# Patient Record
Sex: Female | Born: 1995 | Race: Black or African American | Hispanic: No | Marital: Single | Smoking: Former smoker
Health system: Southern US, Community
[De-identification: ages and names within clinical notes are randomized; demographics above are authoritative.]

## PROBLEM LIST (undated history)

## (undated) DIAGNOSIS — J45909 Unspecified asthma, uncomplicated: Secondary | ICD-10-CM

---
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
## 2015-05-30 ENCOUNTER — Emergency Department (HOSPITAL_COMMUNITY): Payer: Medicaid Other

## 2015-05-30 ENCOUNTER — Encounter (HOSPITAL_COMMUNITY): Payer: Self-pay | Admitting: Emergency Medicine

## 2015-05-30 ENCOUNTER — Emergency Department (HOSPITAL_COMMUNITY)
Admission: EM | Admit: 2015-05-30 | Discharge: 2015-05-30 | Disposition: A | Discharge status: Home / Self Care | Payer: Medicaid Other | Attending: Physician Assistant | Admitting: Physician Assistant

## 2015-05-30 DIAGNOSIS — Z87891 Personal history of nicotine dependence: Secondary | ICD-10-CM | POA: Insufficient documentation

## 2015-05-30 DIAGNOSIS — Z3202 Encounter for pregnancy test, result negative: Secondary | ICD-10-CM | POA: Diagnosis not present

## 2015-05-30 DIAGNOSIS — R000 Tachycardia, unspecified: Secondary | ICD-10-CM | POA: Insufficient documentation

## 2015-05-30 DIAGNOSIS — R55 Syncope and collapse: Secondary | ICD-10-CM | POA: Insufficient documentation

## 2015-05-30 DIAGNOSIS — Z79899 Other long term (current) drug therapy: Secondary | ICD-10-CM | POA: Insufficient documentation

## 2015-05-30 DIAGNOSIS — A084 Viral intestinal infection, unspecified: Secondary | ICD-10-CM | POA: Insufficient documentation

## 2015-05-30 DIAGNOSIS — J45909 Unspecified asthma, uncomplicated: Secondary | ICD-10-CM | POA: Diagnosis not present

## 2015-05-30 DIAGNOSIS — R1110 Vomiting, unspecified: Secondary | ICD-10-CM | POA: Diagnosis present

## 2015-05-30 LAB — CBG MONITORING, ED: GLUCOSE-CAPILLARY: 95 mg/dL (ref 65–99)

## 2015-05-30 LAB — BASIC METABOLIC PANEL
Anion gap: 11 (ref 5–15)
BUN: 8 mg/dL (ref 6–20)
CHLORIDE: 102 mmol/L (ref 101–111)
CO2: 22 mmol/L (ref 22–32)
CREATININE: 0.65 mg/dL (ref 0.44–1.00)
Calcium: 9.1 mg/dL (ref 8.9–10.3)
Glucose, Bld: 94 mg/dL (ref 65–99)
POTASSIUM: 3.6 mmol/L (ref 3.5–5.1)
SODIUM: 135 mmol/L (ref 135–145)

## 2015-05-30 LAB — CBC
HCT: 40.7 % (ref 36.0–46.0)
Hemoglobin: 13.7 g/dL (ref 12.0–15.0)
MCH: 30.1 pg (ref 26.0–34.0)
MCHC: 33.7 g/dL (ref 30.0–36.0)
MCV: 89.5 fL (ref 78.0–100.0)
Platelets: 213 10*3/uL (ref 150–400)
RBC: 4.55 MIL/uL (ref 3.87–5.11)
RDW: 14 % (ref 11.5–15.5)
WBC: 5.1 10*3/uL (ref 4.0–10.5)

## 2015-05-30 LAB — URINALYSIS, ROUTINE W REFLEX MICROSCOPIC
GLUCOSE, UA: NEGATIVE mg/dL
Hgb urine dipstick: NEGATIVE
LEUKOCYTES UA: NEGATIVE
Nitrite: NEGATIVE
PROTEIN: NEGATIVE mg/dL
Specific Gravity, Urine: 1.031 — ABNORMAL HIGH (ref 1.005–1.030)
pH: 5.5 (ref 5.0–8.0)

## 2015-05-30 LAB — I-STAT BETA HCG BLOOD, ED (MC, WL, AP ONLY)

## 2015-05-30 MED ORDER — SODIUM CHLORIDE 0.9 % IV BOLUS (SEPSIS)
1000.0000 mL | Freq: Once | INTRAVENOUS | Status: AC
  Administered 2015-05-30: 1000 mL via INTRAVENOUS

## 2015-05-30 MED ORDER — ONDANSETRON HCL 4 MG/2ML IJ SOLN
4.0000 mg | Freq: Once | INTRAMUSCULAR | Status: AC
  Administered 2015-05-30: 4 mg via INTRAVENOUS
  Filled 2015-05-30: qty 2

## 2015-05-30 MED ORDER — ACETAMINOPHEN 325 MG PO TABS
650.0000 mg | ORAL_TABLET | Freq: Once | ORAL | Status: AC | PRN
  Administered 2015-05-30: 650 mg via ORAL
  Filled 2015-05-30: qty 2

## 2015-05-30 MED ORDER — SODIUM CHLORIDE 0.9 % IV BOLUS (SEPSIS)
1000.0000 mL | Freq: Once | INTRAVENOUS | Status: AC
Start: 2015-05-30 — End: 2015-05-30
  Administered 2015-05-30: 1000 mL via INTRAVENOUS

## 2015-05-30 MED ORDER — IBUPROFEN 800 MG PO TABS
800.0000 mg | ORAL_TABLET | Freq: Once | ORAL | Status: AC
  Administered 2015-05-30: 800 mg via ORAL
  Filled 2015-05-30: qty 1

## 2015-05-30 MED ORDER — ONDANSETRON HCL 4 MG PO TABS: 4.0000 mg | ORAL_TABLET | Freq: Three times a day (TID) | ORAL | Status: DC | PRN

## 2015-05-30 NOTE — Discharge Instructions (Signed)

## 2015-05-30 NOTE — ED Notes (Signed)
Patient presents for 3 episodes of syncope in 2 days and 3 episodes of emesis today. Denies fever/chills at home, denies diarrhea. Denies urinary symptoms.

## 2015-05-30 NOTE — ED Provider Notes (Signed)
CSN: [id number]     Arrival date & time 05/30/15  1620 History   First MD Initiated Contact with Patient 05/30/15 1831     Chief Complaint  Patient presents with  . Near Syncope  . Emesis     (Consider location/radiation/quality/duration/timing/severity/associated sxs/prior Treatment) HPI  Patient's 20 year old female presenting with 2-3 days of nausea, low-grade fever, congestion, malaise. Symptoms are consistent with the flu which is going around.  Patient denies any urinary complaints. Does complain of back pain occasionally. No history of stones. Patient denies any diarrhea. Patient has not been taking anything over-the-counter at home.  Patient reports feeling lightheaded several times in the last couple days. She does feel dehydrated.  Past Medical History  Diagnosis Date  . Asthma    History reviewed. No pertinent past surgical history. No family history on file. Social History  Substance Use Topics  . Smoking status: Former Games developer  . Smokeless tobacco: None  . Alcohol Use: No   OB History    No data available     Review of Systems  Constitutional: Positive for fever and fatigue. Negative for activity change.  HENT: Positive for congestion.   Respiratory: Negative for shortness of breath.   Cardiovascular: Negative for chest pain.  Gastrointestinal: Positive for nausea and vomiting. Negative for abdominal pain and diarrhea.  Genitourinary: Positive for flank pain. Negative for dysuria.  Neurological: Positive for weakness and light-headedness. Negative for dizziness.  Psychiatric/Behavioral: Negative for agitation.      Allergies  Review of patient's allergies indicates no known allergies.  Home Medications   Prior to Admission medications   Medication Sig Start Date End Date Taking? Authorizing Provider  diphenhydrAMINE (BENADRYL) 25 MG tablet Take 25 mg by mouth every 6 (six) hours as needed (vomitting).   Yes Historical Provider, MD  acetaminophen  (TYLENOL) 500 MG tablet Take 1,000 mg by mouth every 6 (six) hours as needed for mild pain.    Historical Provider, MD  albuterol (PROVENTIL HFA;VENTOLIN HFA) 108 (90 BASE) MCG/ACT inhaler Inhale 4 puffs into the lungs every 6 (six) hours as needed for wheezing or shortness of breath.    Historical Provider, MD  Alum & Mag Hydroxide-Simeth (MAGIC MOUTHWASH W/LIDOCAINE) SOLN Take 5 mLs by mouth 4 (four) times daily as needed for mouth pain. Patient not taking: Reported on 10/02/2014 09/29/14   [name], PA-C  clindamycin (CLEOCIN) 150 MG capsule Take 2 capsules (300 mg total) by mouth 3 (three) times daily. Patient not taking: Reported on 05/30/2015 10/02/14   [name], PA-C  cyclobenzaprine (FLEXERIL) 10 MG tablet Take 1 tablet (10 mg total) by mouth 3 (three) times daily as needed for muscle spasms. Patient not taking: Reported on 09/28/2014 08/15/14   [name], MD  fexofenadine (ALLEGRA) 180 MG tablet Take 180 mg by mouth daily as needed for allergies.     Historical Provider, MD  HYDROcodone-acetaminophen (HYCET) 7.5-325 mg/15 ml solution Take 15 mLs by mouth every 4 (four) hours as needed for moderate pain. Patient not taking: Reported on 05/30/2015 10/02/14 10/02/15  [name], PA-C  ibuprofen (ADVIL,MOTRIN) 600 MG tablet Take 1 tablet (600 mg total) by mouth every 6 (six) hours as needed. Patient not taking: Reported on 05/30/2015 09/29/14   [name], PA-C  ondansetron (ZOFRAN) 4 MG tablet Take 1 tablet (4 mg total) by mouth every 8 (eight) hours as needed for nausea or vomiting. 05/30/15   Bijon Mineer Lyn Kailey Esquilin, MD  traMADol (ULTRAM) 50 MG tablet Take 1 tablet (  50 mg total) by mouth every 6 (six) hours as needed. Patient not taking: Reported on 09/28/2014 08/15/14   [name], MD   BP 118/68 mmHg  Pulse 99  Temp(Src) 101.2 F (38.4 C) (Oral)  Resp 20  SpO2 100%  LMP 05/09/2015 Physical Exam  Constitutional: She is oriented to person, place, and time. She  appears well-developed and well-nourished.  HENT:  Head: Normocephalic and atraumatic.  Dry mucous membranes  Eyes: Conjunctivae are normal. Right eye exhibits no discharge.  Neck: Neck supple.  Cardiovascular: Regular rhythm and normal heart sounds.   No murmur heard. Tachycardia.  Pulmonary/Chest: Effort normal and breath sounds normal. She has no wheezes. She has no rales.  Abdominal: Soft. She exhibits no distension. There is no tenderness.  Musculoskeletal: Normal range of motion. She exhibits no edema.  Neurological: She is oriented to person, place, and time. No cranial nerve deficit.  Skin: Skin is warm and dry. No rash noted. She is not diaphoretic.  Psychiatric: She has a normal mood and affect. Her behavior is normal.  Nursing note and vitals reviewed.   ED Course  Procedures (including critical care time) Labs Review Labs Reviewed  URINALYSIS, ROUTINE W REFLEX MICROSCOPIC (NOT AT [facility]) - Abnormal; Notable for the following:    APPearance CLOUDY (*)    Specific Gravity, Urine 1.031 (*)    Bilirubin Urine SMALL (*)    Ketones, ur >80 (*)    All other components within normal limits  BASIC METABOLIC PANEL  CBC  CBG MONITORING, ED  I-STAT BETA HCG BLOOD, ED ([facility], [facility], [facility] ONLY)    Imaging Review Dg Chest 2 View  05/30/2015  CLINICAL DATA:  Three episodes of syncope in 2 days with vomiting today. EXAM: CHEST  2 VIEW COMPARISON:  08/15/2014. FINDINGS: The heart size and mediastinal contours are within normal limits. Both lungs are clear. The visualized skeletal structures are unremarkable. IMPRESSION: No active cardiopulmonary disease. Electronically Signed   By: Kennith Center M.D.   On: 05/30/2015 19:00   I have personally reviewed and evaluated these images and lab results as part of my medical decision-making.   EKG Interpretation   Date/Time:  Wednesday May 30 2015 16:43:03 EST Ventricular Rate:  125 PR Interval:  130 QRS Duration: 71 QT Interval:  274 QTC  Calculation: 395 R Axis:   45 Text Interpretation:  Sinus tachycardia Borderline repolarization  abnormality Sinus tachycardia Confirmed by Kandis Mannan ([id number]) on  05/30/2015 6:33:11 PM      MDM   Final diagnoses:  Viral gastroenteritis    Patient's 20 year old female presenting with a couple days of flulike illness. Patient had mild nausea occasional vomiting lightheadedness, congestion, cough. She's had fevers at home which are mild but has not been taking anything. She on arrival here is tachycardic, dry mucous membranes. We will start IV, give fluids. Doubt any intra-abdominal pathology given her soft abdomen on exam. Suspect that she is having the viral syndrome that is going around. We'll make sure the patient does not have a urinary tract infection or pneumonia requiring antibiotics. Otherwise we will give symptomatic care for home.  Patient tolerated PO, feels improved.   [place] Randall An, MD 05/30/15 2258

## 2015-05-30 NOTE — ED Notes (Signed)
Patient transported to X-ray 

## 2015-05-30 NOTE — ED Notes (Signed)
Pt able to tolerate ginger ale with no difficulty

## 2015-05-30 NOTE — ED Notes (Signed)
Off-going nurse reports patient refusing IV. I have spoken with the pt and she states that she is willing to try.

## 2015-06-30 ENCOUNTER — Encounter (HOSPITAL_COMMUNITY): Payer: Self-pay | Admitting: Emergency Medicine

## 2015-06-30 ENCOUNTER — Emergency Department (HOSPITAL_COMMUNITY)
Admission: EM | Admit: 2015-06-30 | Discharge: 2015-06-30 | Disposition: A | Discharge status: Home / Self Care | Payer: Medicaid Other | Attending: Emergency Medicine | Admitting: Emergency Medicine

## 2015-06-30 ENCOUNTER — Emergency Department (HOSPITAL_COMMUNITY): Payer: Medicaid Other

## 2015-06-30 DIAGNOSIS — Y9289 Other specified places as the place of occurrence of the external cause: Secondary | ICD-10-CM | POA: Diagnosis not present

## 2015-06-30 DIAGNOSIS — S86911A Strain of unspecified muscle(s) and tendon(s) at lower leg level, right leg, initial encounter: Secondary | ICD-10-CM | POA: Insufficient documentation

## 2015-06-30 DIAGNOSIS — Z87891 Personal history of nicotine dependence: Secondary | ICD-10-CM | POA: Diagnosis not present

## 2015-06-30 DIAGNOSIS — Z79899 Other long term (current) drug therapy: Secondary | ICD-10-CM | POA: Insufficient documentation

## 2015-06-30 DIAGNOSIS — Y9339 Activity, other involving climbing, rappelling and jumping off: Secondary | ICD-10-CM | POA: Diagnosis not present

## 2015-06-30 DIAGNOSIS — W1789XA Other fall from one level to another, initial encounter: Secondary | ICD-10-CM | POA: Insufficient documentation

## 2015-06-30 DIAGNOSIS — Y998 Other external cause status: Secondary | ICD-10-CM | POA: Insufficient documentation

## 2015-06-30 DIAGNOSIS — J45909 Unspecified asthma, uncomplicated: Secondary | ICD-10-CM | POA: Diagnosis not present

## 2015-06-30 DIAGNOSIS — S8991XA Unspecified injury of right lower leg, initial encounter: Secondary | ICD-10-CM | POA: Diagnosis present

## 2015-06-30 DIAGNOSIS — S8992XA Unspecified injury of left lower leg, initial encounter: Secondary | ICD-10-CM | POA: Diagnosis not present

## 2015-06-30 DIAGNOSIS — W19XXXA Unspecified fall, initial encounter: Secondary | ICD-10-CM

## 2015-06-30 MED ORDER — HYDROCODONE-ACETAMINOPHEN 5-325 MG PO TABS: 1.0000 | ORAL_TABLET | Freq: Four times a day (QID) | ORAL | Status: DC | PRN

## 2015-06-30 MED ORDER — HYDROCODONE-ACETAMINOPHEN 5-325 MG PO TABS
2.0000 | ORAL_TABLET | Freq: Once | ORAL | Status: AC
  Administered 2015-06-30: 2 via ORAL
  Filled 2015-06-30: qty 2

## 2015-06-30 MED ORDER — IBUPROFEN 800 MG PO TABS: 800.0000 mg | ORAL_TABLET | Freq: Three times a day (TID) | ORAL | Status: DC

## 2015-06-30 NOTE — ED Provider Notes (Signed)
CSN: [id number]     Arrival date & time 06/30/15  1547 History  By signing my name below, I, Tracey Lam, attest that this documentation has been prepared under the direction and in the presence of Annasofia Pohl PA-C. Electronically Signed: Leone Payor, Scribe. 06/30/2015. 5:03 PM.    Chief Complaint  Patient presents with  . Knee Pain  . Fall   The history is provided by the patient. No language interpreter was used.     HPI Comments: Tracey Lam is a 20 y.o. female who presents to the Emergency Department complaining of sudden onset, constant right leg pain and left shin pain that began after jumping out of a window. Patient states she was in a situation that she need to remove herself from so she jumped out a second story and fell about 10-15 ft. She states most of her weight was on her right leg which is hurting the most. She states she was not able to stand immediately after the fall. She denies head injury or LOC. She states moving or bearing weight on the right leg is painful. She denies numbness, weakness.    Past Medical History  Diagnosis Date  . Asthma    History reviewed. No pertinent past surgical history. History reviewed. No pertinent family history. Social History  Substance Use Topics  . Smoking status: Former Games developer  . Smokeless tobacco: None  . Alcohol Use: No   OB History    No data available     Review of Systems  Musculoskeletal: Positive for myalgias, joint swelling (right knee) and arthralgias (right leg, left shin).  Skin: Negative for wound.  Neurological: Negative for weakness and numbness.    Allergies  Review of patient's allergies indicates no known allergies.  Home Medications   Prior to Admission medications   Medication Sig Start Date End Date Taking? Authorizing Provider  acetaminophen (TYLENOL) 500 MG tablet Take 1,000 mg by mouth every 6 (six) hours as needed for mild pain.    Historical Provider, MD  albuterol (PROVENTIL HFA;VENTOLIN HFA)  108 (90 BASE) MCG/ACT inhaler Inhale 4 puffs into the lungs every 6 (six) hours as needed for wheezing or shortness of breath.    Historical Provider, MD  Alum & Mag Hydroxide-Simeth (MAGIC MOUTHWASH W/LIDOCAINE) SOLN Take 5 mLs by mouth 4 (four) times daily as needed for mouth pain. Patient not taking: Reported on 10/02/2014 09/29/14   [name], PA-C  clindamycin (CLEOCIN) 150 MG capsule Take 2 capsules (300 mg total) by mouth 3 (three) times daily. Patient not taking: Reported on 05/30/2015 10/02/14   [name], PA-C  cyclobenzaprine (FLEXERIL) 10 MG tablet Take 1 tablet (10 mg total) by mouth 3 (three) times daily as needed for muscle spasms. Patient not taking: Reported on 09/28/2014 08/15/14   [name], MD  diphenhydrAMINE (BENADRYL) 25 MG tablet Take 25 mg by mouth every 6 (six) hours as needed (vomitting).    Historical Provider, MD  fexofenadine (ALLEGRA) 180 MG tablet Take 180 mg by mouth daily as needed for allergies.     Historical Provider, MD  HYDROcodone-acetaminophen (HYCET) 7.5-325 mg/15 ml solution Take 15 mLs by mouth every 4 (four) hours as needed for moderate pain. Patient not taking: Reported on 05/30/2015 10/02/14 10/02/15  [name], PA-C  ibuprofen (ADVIL,MOTRIN) 600 MG tablet Take 1 tablet (600 mg total) by mouth every 6 (six) hours as needed. Patient not taking: Reported on 05/30/2015 09/29/14   [name], PA-C  ondansetron (ZOFRAN) 4 MG  tablet Take 1 tablet (4 mg total) by mouth every 8 (eight) hours as needed for nausea or vomiting. 05/30/15   [name], MD  traMADol (ULTRAM) 50 MG tablet Take 1 tablet (50 mg total) by mouth every 6 (six) hours as needed. Patient not taking: Reported on 09/28/2014 08/15/14   [name], MD   BP 110/59 mmHg  Pulse 82  Temp(Src) 98.2 F (36.8 C) (Oral)  Resp 16  SpO2 98%  LMP 06/30/2015 Physical Exam  Constitutional: She is oriented to person, place, and time. She appears well-developed and  well-nourished. No distress.  HENT:  Head: Normocephalic and atraumatic.  Right Ear: External ear normal.  Left Ear: External ear normal.  Nose: Nose normal.  Mouth/Throat: Oropharynx is clear and moist. No oropharyngeal exudate.  Eyes: Conjunctivae and EOM are normal. Pupils are equal, round, and reactive to light. Right eye exhibits no discharge. Left eye exhibits no discharge. No scleral icterus.  Neck: Normal range of motion. Neck supple. No JVD present. No tracheal deviation present.  Cardiovascular: Normal rate and regular rhythm.   Pulmonary/Chest: Effort normal and breath sounds normal. No stridor. No respiratory distress.  Abdominal: She exhibits no distension.  Musculoskeletal: She exhibits edema and tenderness.       Right hip: Normal.       Right knee: She exhibits decreased range of motion and swelling. She exhibits no effusion, no ecchymosis, no deformity, no laceration, no erythema, normal alignment and normal patellar mobility. Tenderness found. No patellar tendon tenderness noted.       Right ankle: Normal.       Right upper leg: She exhibits no tenderness, no bony tenderness, no swelling and no deformity.       Right lower leg: She exhibits no tenderness, no bony tenderness and no swelling.  Lymphadenopathy:    She has no cervical adenopathy.  Neurological: She is alert and oriented to person, place, and time. She exhibits normal muscle tone. Coordination normal.  Skin: Skin is warm and dry. No rash noted. She is not diaphoretic. No erythema. No pallor.  Psychiatric: She has a normal mood and affect. Her behavior is normal. Judgment and thought content normal.  Nursing note and vitals reviewed.   ED Course  Procedures (including critical care time)  DIAGNOSTIC STUDIES: Oxygen Saturation is 98% on RA, normal by my interpretation.    COORDINATION OF CARE: 5:11 PM Discussed treatment plan with pt at bedside and pt agreed to plan.   Labs Review Labs Reviewed - No  data to display  Imaging Review Dg Tibia/fibula Left  06/30/2015  CLINICAL DATA:  Left tibia/ fibula pain after fall EXAM: LEFT TIBIA AND FIBULA - 2 VIEW COMPARISON:  None. FINDINGS: There is no evidence of fracture or other focal bone lesions. Soft tissues are unremarkable. IMPRESSION: Negative. Electronically Signed   By: Delbert Phenix M.D.   On: 06/30/2015 17:00   Dg Knee Complete 4 Views Right  06/30/2015  CLINICAL DATA:  Right knee pain after fall EXAM: RIGHT KNEE - COMPLETE 4+ VIEW COMPARISON:  None. FINDINGS: There is no evidence of fracture, dislocation, or joint effusion. There is no evidence of arthropathy or other focal bone abnormality. Soft tissues are unremarkable. IMPRESSION: Negative. Electronically Signed   By: Delbert Phenix M.D.   On: 06/30/2015 16:59   I have personally reviewed and evaluated these images as part of my medical decision-making.   EKG Interpretation None      MDM   Patient  is a 20 year old female who felt she was unsafe situation and thought she had the choice but to jump out of a window that was approximately 10 feet off the ground. She landed mostly on her right leg and right side, landing in the mud of the yard. She has not been able to bear weight.  She complains of right knee pain and swelling but also has diffuse right leg and hip pain. X-rays were negative for fracture dislocation. Exam is pertinent for mild swelling, but severely limited by pain, unable to efficiently evaluate ligamentous injury. We'll immobilize get crutches and pain medications with Ortho follow-up. Patient's muscle compartments of likely soft, she had normal dorsal pedis and posterior tibialis pulses, normal sensation. Will also give resource list. She was discharged in good condition.  Final diagnoses:  Fall, initial encounter  Strain of right knee and leg, initial encounter    I personally performed the services described in this documentation, which was scribed in my presence.  The recorded information has been reviewed and is accurate.     Danelle BerryLeisa Gaines Cartmell, PA-C 07/03/15 2101  Linwood DibblesJon Knapp, MD 07/03/15 2130

## 2015-06-30 NOTE — Discharge Instructions (Signed)
Knee Sprain A knee sprain is a tear in one of the strong, fibrous tissues that connect the bones (ligaments) in your knee. The severity of the sprain depends on how much of the ligament is torn. The tear can be either partial or complete. CAUSES  Often, sprains are a result of a fall or injury. The force of the impact causes the fibers of your ligament to stretch too much. This excess tension causes the fibers of your ligament to tear. SIGNS AND SYMPTOMS  You may have some loss of motion in your knee. Other symptoms include:  Bruising.  Pain in the knee area.  Tenderness of the knee to the touch.  Swelling. DIAGNOSIS  To diagnose a knee sprain, your health care provider will physically examine your knee. Your health care provider may also suggest an X-ray exam of your knee to make sure no bones are broken. TREATMENT  If your ligament is only partially torn, treatment usually involves keeping the knee in a fixed position (immobilization) or bracing your knee for activities that require movement for several weeks. To do this, your health care provider will apply a bandage, cast, or splint to keep your knee from moving and to support your knee during movement until it heals. For a partially torn ligament, the healing process usually takes 4-6 weeks. If your ligament is completely torn, depending on which ligament it is, you may need surgery to reconnect the ligament to the bone or reconstruct it. After surgery, a cast or splint may be applied and will need to stay on your knee for 4-6 weeks while your ligament heals. HOME CARE INSTRUCTIONS  Keep your injured knee elevated to decrease swelling.  To ease pain and swelling, apply ice to the injured area:  Put ice in a plastic bag.  Place a towel between your skin and the bag.  Leave the ice on for 20 minutes, 2-3 times a day.  Only take medicine for pain as directed by your health care provider.  Do not leave your knee unprotected until  pain and stiffness go away (usually 4-6 weeks).  If you have a cast or splint, do not allow it to get wet. If you have been instructed not to remove it, cover it with a plastic bag when you shower or bathe. Do not swim.  Your health care provider may suggest exercises for you to do during your recovery to prevent or limit permanent weakness and stiffness. SEEK IMMEDIATE MEDICAL CARE IF:  Your cast or splint becomes damaged.  Your pain becomes worse.  You have significant pain, swelling, or numbness below the cast or splint. MAKE SURE YOU:  Understand these instructions.  Will watch your condition.  Will get help right away if you are not doing well or get worse.   This information is not intended to replace advice given to you by your health care provider. Make sure you discuss any questions you have with your health care provider.   Document Released: 03/31/2005 Document Revised: 04/21/2014 Document Reviewed: 11/10/2012 Elsevier Interactive Patient Education 2016 [employer]   RICE for Routine Care of Injuries Theroutine careofmanyinjuriesincludes rest, ice, compression, and elevation (RICE therapy). RICE therapy is often recommended for injuries to soft tissues, such as a muscle strain, ligament injuries, bruises, and overuse injuries. It can also be used for some bony injuries. Using RICE therapy can help to relieve pain, lessen swelling, and enable your body to heal. Rest Rest is required to allow your body to  heal. This usually involves reducing your normal activities and avoiding use of the injured part of your body. Generally, you can return to your normal activities when you are comfortable and have been given permission by your health care provider. Ice Icing your injury helps to keep the swelling down, and it lessens pain. Do not apply ice directly to your skin.  Put ice in a plastic bag.  Place a towel between your skin and the bag.  Leave the ice on for 20  minutes, 2-3 times a day. Do this for as long as you are directed by your health care provider. Compression Compression means putting pressure on the injured area. Compression helps to keep swelling down, gives support, and helps with discomfort. Compression may be done with an elastic bandage. If an elastic bandage has been applied, follow these general tips:  Remove and reapply the bandage every 3-4 hours or as directed by your health care provider.  Make sure the bandage is not wrapped too tightly, because this can cut off circulation. If part of your body beyond the bandage becomes blue, numb, cold, swollen, or more painful, your bandage is most likely too tight. If this occurs, remove your bandage and reapply it more loosely.  See your health care provider if the bandage seems to be making your problems worse rather than better. Elevation Elevation means keeping the injured area raised. This helps to lessen swelling and decrease pain. If possible, your injured area should be elevated at or above the level of your heart or the center of your chest. WHEN SHOULD I SEEK MEDICAL CARE? You should seek medical care if:  Your pain and swelling continue.  Your symptoms are getting worse rather than improving. These symptoms may indicate that further evaluation or further X-rays are needed. Sometimes, X-rays may not show a small broken bone (fracture) until a number of days later. Make a follow-up appointment with your health care provider. WHEN SHOULD I SEEK IMMEDIATE MEDICAL CARE? You should seek immediate medical care if:  You have sudden severe pain at or below the area of your injury.  You have redness or increased swelling around your injury.  You have tingling or numbness at or below the area of your injury that does not improve after you remove the elastic bandage.   This information is not intended to replace advice given to you by your health care provider. Make sure you discuss any  questions you have with your health care provider.   Document Released: 07/13/2000 Document Revised: 12/20/2014 Document Reviewed: 03/08/2014 Elsevier Interactive Patient Education 2016 [employer] Inc[employer]   Tendon Injury Tendons are strong, cordlike structures that connect muscle to bone. Tendons are made up of woven fibers, like a rope. A tendon injury is a tear (rupture) of the tendon. The rupture may be partial (only a few of the fibers in your tendon rupture) or complete (your entire tendon ruptures). CAUSES  Tendon injuries can be caused by high-stress activities, such as sports. They also can be caused by a repetitive injury or by a single injury from an excessive, rapid force. SYMPTOMS  Symptoms of tendon injury include pain when you move the joint close to the tendon. Other symptoms are swelling, redness, and warmth. DIAGNOSIS  Tendon injuries often can be diagnosed by physical exam. However, sometimes an X-ray exam or advanced imaging, such as magnetic resonance imaging (MRI), is necessary to determine the extent of the injury. TREATMENT  Partial tendon ruptures often can be treated  with immobilization. A splint, bandage, or removable brace usually is used to immobilize the injured tendon. Most injured tendons need to be immobilized for 1-2 months before they are completely healed. Complete tendon ruptures may require surgical reattachment.   This information is not intended to replace advice given to you by your health care provider. Make sure you discuss any questions you have with your health care provider.   Document Released: 05/08/2004 Document Revised: 03/20/2011 Document Reviewed: 06/22/2011 Elsevier Interactive Patient Education 2016 [employer] Inc[employer]  Muscle Strain A muscle strain is an injury that occurs when a muscle is stretched beyond its normal length. Usually a small number of muscle fibers are torn when this happens. Muscle strain is rated in degrees. First-degree strains  have the least amount of muscle fiber tearing and pain. Second-degree and third-degree strains have increasingly more tearing and pain.  Usually, recovery from muscle strain takes 1-2 weeks. Complete healing takes 5-6 weeks.  CAUSES  Muscle strain happens when a sudden, violent force placed on a muscle stretches it too far. This may occur with lifting, sports, or a fall.  RISK FACTORS Muscle strain is especially common in athletes.  SIGNS AND SYMPTOMS At the site of the muscle strain, there may be:  Pain.  Bruising.  Swelling.  Difficulty using the muscle due to pain or lack of normal function. DIAGNOSIS  Your health care provider will perform a physical exam and ask about your medical history. TREATMENT  Often, the best treatment for a muscle strain is resting, icing, and applying cold compresses to the injured area.  HOME CARE INSTRUCTIONS   Use the PRICE method of treatment to promote muscle healing during the first 2-3 days after your injury. The PRICE method involves:  Protecting the muscle from being injured again.  Restricting your activity and resting the injured body part.  Icing your injury. To do this, put ice in a plastic bag. Place a towel between your skin and the bag. Then, apply the ice and leave it on from 15-20 minutes each hour. After the third day, switch to moist heat packs.  Apply compression to the injured area with a splint or elastic bandage. Be careful not to wrap it too tightly. This may interfere with blood circulation or increase swelling.  Elevate the injured body part above the level of your heart as often as you can.  Only take over-the-counter or prescription medicines for pain, discomfort, or fever as directed by your health care provider.  Warming up prior to exercise helps to prevent future muscle strains. SEEK MEDICAL CARE IF:   You have increasing pain or swelling in the injured area.  You have numbness, tingling, or a significant loss  of strength in the injured area. MAKE SURE YOU:   Understand these instructions.  Will watch your condition.  Will get help right away if you are not doing well or get worse.   This information is not intended to replace advice given to you by your health care provider. Make sure you discuss any questions you have with your health care provider.   Document Released: 03/31/2005 Document Revised: 01/19/2013 Document Reviewed: 10/28/2012 Elsevier Interactive Patient Education [employer]

## 2015-06-30 NOTE — ED Notes (Signed)
Pt reports she jumped out of a 2nd story window and fell on the ground. Pt landed on out-stretched hands and feet. Did not hit head. Pt complains of pain in R knee and L shin. No obvious deformity.

## 2015-07-06 ENCOUNTER — Encounter (HOSPITAL_COMMUNITY): Payer: Self-pay | Admitting: Emergency Medicine

## 2015-07-06 ENCOUNTER — Emergency Department (HOSPITAL_COMMUNITY)
Admission: EM | Admit: 2015-07-06 | Discharge: 2015-07-06 | Disposition: A | Discharge status: Home / Self Care | Payer: Medicaid Other | Attending: Emergency Medicine | Admitting: Emergency Medicine

## 2015-07-06 DIAGNOSIS — S8991XD Unspecified injury of right lower leg, subsequent encounter: Secondary | ICD-10-CM

## 2015-07-06 DIAGNOSIS — Z87891 Personal history of nicotine dependence: Secondary | ICD-10-CM | POA: Insufficient documentation

## 2015-07-06 DIAGNOSIS — X58XXXD Exposure to other specified factors, subsequent encounter: Secondary | ICD-10-CM | POA: Insufficient documentation

## 2015-07-06 DIAGNOSIS — Z79899 Other long term (current) drug therapy: Secondary | ICD-10-CM | POA: Diagnosis not present

## 2015-07-06 DIAGNOSIS — J45909 Unspecified asthma, uncomplicated: Secondary | ICD-10-CM | POA: Diagnosis not present

## 2015-07-06 MED ORDER — CYCLOBENZAPRINE HCL 5 MG PO TABS: 5.0000 mg | ORAL_TABLET | Freq: Three times a day (TID) | ORAL | Status: DC

## 2015-07-06 MED ORDER — TRAMADOL HCL 50 MG PO TABS: 50.0000 mg | ORAL_TABLET | Freq: Four times a day (QID) | ORAL | Status: DC | PRN

## 2015-07-06 MED ORDER — CYCLOBENZAPRINE HCL 10 MG PO TABS
5.0000 mg | ORAL_TABLET | Freq: Once | ORAL | Status: AC
  Administered 2015-07-06: 5 mg via ORAL
  Filled 2015-07-06: qty 1

## 2015-07-06 NOTE — ED Notes (Signed)
Pt was seen on 3/16 when she jumped out of a window and landed on her leg wrong. Pt states she called to follow up with the suggested ortho specialist, but was not able to get an appt until this Wed. She states the pain is unmanageable. Pt has been taking motrin, last dose of which was around 1730.

## 2015-07-06 NOTE — Discharge Instructions (Signed)
Take the immobilizer off at night and do the exercies 2-3 times per day

## 2015-07-06 NOTE — ED Provider Notes (Signed)
History  By signing my name below, I, Karle PlumberJennifer Tensley, attest that this documentation has been prepared under the direction and in the presence of Earley FavorGail Laketra Bowdish, FNP. Electronically Signed: Karle PlumberJennifer Tensley, ED Scribe. 07/06/2015. 9:21 PM.  Chief Complaint  Patient presents with  . Leg Injury   The history is provided by the patient and medical records. No language interpreter was used.    HPI Comments:  Tracey Lam is a 20 y.o. female who presents to the Emergency Department complaining of continued right knee pain after an injury about 8 days ago. She reports the pain is still present and she no longer has any pain medication. She was prescribed Vicodin and Ibuprofen and given a knee immobilizer in which she reports wearing at all times. Moving the knee increases her pain. She denies alleviating factors. She denies numbness, tingling or weakness of the right knee or RLE, bruising, wounds, fever, chills, nausea or vomiting. She states she has a follow up appt with orthopedics in a few days on 07/11/15. Her LMP ended yesterday.  Past Medical History  Diagnosis Date  . Asthma    History reviewed. No pertinent past surgical history. No family history on file. Social History  Substance Use Topics  . Smoking status: Former Games developermoker  . Smokeless tobacco: None  . Alcohol Use: No   OB History    No data available     Review of Systems  Constitutional: Negative for fever and chills.  Gastrointestinal: Negative for nausea and vomiting.  Musculoskeletal: Positive for joint swelling and arthralgias.  Skin: Negative for color change and wound.  Neurological: Negative for weakness and numbness.  All other systems reviewed and are negative.   Allergies  Review of patient's allergies indicates no known allergies.  Home Medications   Prior to Admission medications   Medication Sig Start Date End Date Taking? Authorizing Provider  acetaminophen (TYLENOL) 500 MG tablet Take 1,000 mg by mouth  every 6 (six) hours as needed for mild pain.    Historical Provider, MD  albuterol (PROVENTIL HFA;VENTOLIN HFA) 108 (90 BASE) MCG/ACT inhaler Inhale 4 puffs into the lungs every 6 (six) hours as needed for wheezing or shortness of breath.    Historical Provider, MD  Alum & Mag Hydroxide-Simeth (MAGIC MOUTHWASH W/LIDOCAINE) SOLN Take 5 mLs by mouth 4 (four) times daily as needed for mouth pain. Patient not taking: Reported on 10/02/2014 09/29/14   Antony MaduraKelly Humes, PA-C  clindamycin (CLEOCIN) 150 MG capsule Take 2 capsules (300 mg total) by mouth 3 (three) times daily. Patient not taking: Reported on 05/30/2015 10/02/14   Elpidio AnisShari Upstill, PA-C  cyclobenzaprine (FLEXERIL) 5 MG tablet Take 1 tablet (5 mg total) by mouth 3 (three) times daily. 07/06/15   Earley FavorGail Gertie Broerman, NP  diphenhydrAMINE (BENADRYL) 25 MG tablet Take 25 mg by mouth every 6 (six) hours as needed (vomitting).    Historical Provider, MD  fexofenadine (ALLEGRA) 180 MG tablet Take 180 mg by mouth daily as needed for allergies.     Historical Provider, MD  HYDROcodone-acetaminophen (NORCO/VICODIN) 5-325 MG tablet Take 1-2 tablets by mouth every 6 (six) hours as needed for severe pain. 06/30/15   Danelle BerryLeisa Tapia, PA-C  ibuprofen (ADVIL,MOTRIN) 800 MG tablet Take 1 tablet (800 mg total) by mouth 3 (three) times daily. 06/30/15   Danelle BerryLeisa Tapia, PA-C  ondansetron (ZOFRAN) 4 MG tablet Take 1 tablet (4 mg total) by mouth every 8 (eight) hours as needed for nausea or vomiting. 05/30/15   Courteney Randall AnLyn Mackuen, MD  traMADol (ULTRAM) 50 MG tablet Take 1 tablet (50 mg total) by mouth every 6 (six) hours as needed. 07/06/15   Earley Favor, NP   Triage Vitals: BP 128/80 mmHg  Pulse 86  Temp(Src) 98.2 F (36.8 C) (Oral)  Resp 16  Ht  (1.499 m)  SpO2 99%  LMP 06/30/2015 Physical Exam  Constitutional: She is oriented to person, place, and time. She appears well-developed and well-nourished.  HENT:  Head: Normocephalic and atraumatic.  Eyes: EOM are normal.  Neck:  Normal range of motion.  Cardiovascular: Normal rate.   Pulmonary/Chest: Effort normal.  Musculoskeletal: She exhibits edema and tenderness.       Right knee: She exhibits decreased range of motion and swelling. She exhibits no ecchymosis, no deformity, no laceration, no erythema, normal alignment and no LCL laxity. Tenderness found.  Neurological: She is alert and oriented to person, place, and time.  Skin: Skin is warm and dry.  Psychiatric: She has a normal mood and affect. Her behavior is normal.  Nursing note and vitals reviewed.   ED Course  Procedures (including critical care time) DIAGNOSTIC STUDIES: Oxygen Saturation is 99% on RA, normal by my interpretation.   COORDINATION OF CARE: 9:07 PM- Advised pt to stretch the knee as tolerated. Will prescribe muscle relaxer and told pt to keep ortho appt. Pt verbalizes understanding and agrees to plan.  Medications  cyclobenzaprine (FLEXERIL) tablet 5 mg (not administered)    Patient has not been taking leg out of immobilizer nor preforming any exercises.  Feel this is more pain from immobility.  I've given the patient some exercises that she can perform as well as a muscle relaxer.  I refilled her Vicodin for short-term use.  She is to keep her appointment with orthopedics on Wednesday as scheduled MDM   Final diagnoses:  Knee injury, right, subsequent encounter    I personally performed the services described in this documentation, which was scribed in my presence. The recorded information has been reviewed and is accurate.    Earley Favor, NP 07/06/15 2123  Lorre Nick, MD 07/06/15 2352

## 2015-07-24 ENCOUNTER — Emergency Department (HOSPITAL_COMMUNITY)
Admission: EM | Admit: 2015-07-24 | Discharge: 2015-07-24 | Disposition: A | Discharge status: Home / Self Care | Payer: Medicaid Other | Attending: Emergency Medicine | Admitting: Emergency Medicine

## 2015-07-24 ENCOUNTER — Emergency Department (HOSPITAL_COMMUNITY): Payer: Medicaid Other

## 2015-07-24 ENCOUNTER — Encounter (HOSPITAL_COMMUNITY): Payer: Self-pay | Admitting: Emergency Medicine

## 2015-07-24 DIAGNOSIS — Z87891 Personal history of nicotine dependence: Secondary | ICD-10-CM | POA: Insufficient documentation

## 2015-07-24 DIAGNOSIS — R0602 Shortness of breath: Secondary | ICD-10-CM

## 2015-07-24 DIAGNOSIS — R0789 Other chest pain: Secondary | ICD-10-CM | POA: Diagnosis not present

## 2015-07-24 DIAGNOSIS — Z79899 Other long term (current) drug therapy: Secondary | ICD-10-CM | POA: Insufficient documentation

## 2015-07-24 DIAGNOSIS — J45901 Unspecified asthma with (acute) exacerbation: Secondary | ICD-10-CM | POA: Insufficient documentation

## 2015-07-24 LAB — CBC WITH DIFFERENTIAL/PLATELET
BASOS ABS: 0 10*3/uL (ref 0.0–0.1)
Basophils Relative: 0 %
Eosinophils Absolute: 0 10*3/uL (ref 0.0–0.7)
Eosinophils Relative: 0 %
HEMATOCRIT: 41.8 % (ref 36.0–46.0)
HEMOGLOBIN: 14.8 g/dL (ref 12.0–15.0)
LYMPHS PCT: 18 %
Lymphs Abs: 1.9 10*3/uL (ref 0.7–4.0)
MCH: 29.7 pg (ref 26.0–34.0)
MCHC: 35.4 g/dL (ref 30.0–36.0)
MCV: 83.9 fL (ref 78.0–100.0)
MONO ABS: 0.9 10*3/uL (ref 0.1–1.0)
MONOS PCT: 8 %
NEUTROS ABS: 8.3 10*3/uL — AB (ref 1.7–7.7)
NEUTROS PCT: 74 %
Platelets: 306 10*3/uL (ref 150–400)
RBC: 4.98 MIL/uL (ref 3.87–5.11)
RDW: 13.7 % (ref 11.5–15.5)
WBC: 11.1 10*3/uL — ABNORMAL HIGH (ref 4.0–10.5)

## 2015-07-24 LAB — COMPREHENSIVE METABOLIC PANEL
ALBUMIN: 5.2 g/dL — AB (ref 3.5–5.0)
ALK PHOS: 77 U/L (ref 38–126)
ALT: 13 U/L — AB (ref 14–54)
ANION GAP: 14 (ref 5–15)
AST: 21 U/L (ref 15–41)
BILIRUBIN TOTAL: 1 mg/dL (ref 0.3–1.2)
BUN: 12 mg/dL (ref 6–20)
CALCIUM: 10 mg/dL (ref 8.9–10.3)
CO2: 21 mmol/L — AB (ref 22–32)
CREATININE: 0.62 mg/dL (ref 0.44–1.00)
Chloride: 104 mmol/L (ref 101–111)
GFR calc Af Amer: >60 mL/min (ref >60)
GFR calc non Af Amer: >60 mL/min (ref >60)
GLUCOSE: 95 mg/dL (ref 65–99)
Potassium: 3.4 mmol/L — ABNORMAL LOW (ref 3.5–5.1)
SODIUM: 139 mmol/L (ref 135–145)
TOTAL PROTEIN: 9.5 g/dL — AB (ref 6.5–8.1)

## 2015-07-24 LAB — D-DIMER, QUANTITATIVE: D-Dimer, Quant: <0.27 ug/mL-FEU (ref 0.00–0.50)

## 2015-07-24 LAB — I-STAT BETA HCG BLOOD, ED (MC, WL, AP ONLY): I-stat hCG, quantitative: <5 m[IU]/mL (ref <5)

## 2015-07-24 LAB — TROPONIN I: Troponin I: <0.03 ng/mL (ref <0.031)

## 2015-07-24 MED ORDER — POTASSIUM CHLORIDE ER 20 MEQ PO TBCR: 40.0000 meq | EXTENDED_RELEASE_TABLET | Freq: Every day | ORAL | Status: DC

## 2015-07-24 MED ORDER — ALBUTEROL SULFATE HFA 108 (90 BASE) MCG/ACT IN AERS: 1.0000 | INHALATION_SPRAY | Freq: Four times a day (QID) | RESPIRATORY_TRACT | Status: DC | PRN

## 2015-07-24 NOTE — ED Notes (Signed)
Per pt, states history of asthma-increased symptoms with activity, no SOB at the moment

## 2015-07-24 NOTE — ED Provider Notes (Signed)
Tracey Lam is a 20 y.o. female with a history of asthma, who presents to the Emergency Department complaining of intermittent episodes of sharp chest pain and SOB lasting 10-15 minutes, triggered by exertion, which began in this past week. She states she also had a syncopal episode yesterday following an episode of chest pain and SOB yesterday that occurred during sexual intercourse, which lasted for about a minute before she regained consciousness. She then laid and rested on the bed, which alleviated her symptoms, but her chest pain and SOB were triggered again when she got up to move around. She states she has not been moving much since injuring her right leg on 06/30/15; she states she has kept her right knee in an immobilizer at all times, has sat or laid around for most of the day, and only started walking around again 2-3 days ago. She reports mild SOB but no chest pain presently. She states her symptoms feel different from her usual asthma. She denies any recent surgeries. Patient reports [place] in [place]. She was tachycardic at 117 in triage, when it was rechecked approximately an hour later at my initial evaluation, pulse was down to 85.  Gen: afebrile, NAD HEENT: EOMI, MMM Resp: no resp distress CV: RRR, no MRG Abd: soft, NT, ND MsK: moving all extremities well Neuro: A&O x4  Patient was discussed with Dr. Patria Maneampos who agrees that patient will need lab work and is better suited being seen on the acute side of the emergency department instead of fast track. Chest x-ray and lab work was ordered and patient will be moved to a higher level of care for continued evaluation and management.    [facility] CenterJaime Pilcher Ward, PA-C 07/24/15 1510  Azalia BilisKevin Campos, MD 07/24/15 [phone]

## 2015-07-24 NOTE — ED Provider Notes (Signed)
CSN: [id number]     Arrival date & time 07/24/15  1247 History   First MD Initiated Contact with Patient 07/24/15 1428     Chief Complaint  Patient presents with  . Near Syncope  . Shortness of Breath    HPI   Tracey Lam is an 20 y.o. female with history of asthma who presents to the ED for evaluation of chest pain and SOB. She states that for the past two to three days she has had intermittent episodes of sharp left sided chest pain with associated shortness of breath that last 10-15 minutes. States the pain does not radiate. She states it feels different than past asthma exacerbations. She states that she had a few puffs of albuterol inhaler left at home and tried it when her symptoms first began with little relief. She states that the chest and SOB are sometimes associated with exertion but can also occur at rest. She states that she also had a syncopal episode yesterday during sexual intercourse. Her boyfriend states that pt suddenly became short of breath and passed out for a minute or two, then regained consciousness spontaneously. Pt states she felt very tired after the episode. She states she has passed out only once before. Of note, pt has been pretty much immobile since hurting her knee last month and being placed in a knee immobilizer on 06/30/15. She states that she typically spends her days in bed or on the couch since her injury. Pt denies OCP use. Endorses smoking 1 PPD. Denies h/o surgery, blood clots. Denies significant fam hx.   Past Medical History  Diagnosis Date  . Asthma    History reviewed. No pertinent past surgical history. No family history on file. Social History  Substance Use Topics  . Smoking status: Former Games developermoker  . Smokeless tobacco: None  . Alcohol Use: No   OB History    No data available     Review of Systems  All other systems reviewed and are negative.     Allergies  Review of patient's allergies indicates no known allergies.  Home Medications    Prior to Admission medications   Medication Sig Start Date End Date Taking? Authorizing Provider  acetaminophen (TYLENOL) 500 MG tablet Take 1,000 mg by mouth every 6 (six) hours as needed for mild pain.    Historical Provider, MD  albuterol (PROVENTIL HFA;VENTOLIN HFA) 108 (90 BASE) MCG/ACT inhaler Inhale 4 puffs into the lungs every 6 (six) hours as needed for wheezing or shortness of breath.    Historical Provider, MD  Alum & Mag Hydroxide-Simeth (MAGIC MOUTHWASH W/LIDOCAINE) SOLN Take 5 mLs by mouth 4 (four) times daily as needed for mouth pain. Patient not taking: Reported on 10/02/2014 09/29/14   Antony MaduraKelly Humes, PA-C  clindamycin (CLEOCIN) 150 MG capsule Take 2 capsules (300 mg total) by mouth 3 (three) times daily. Patient not taking: Reported on 05/30/2015 10/02/14   Elpidio AnisShari Upstill, PA-C  cyclobenzaprine (FLEXERIL) 5 MG tablet Take 1 tablet (5 mg total) by mouth 3 (three) times daily. 07/06/15   Earley FavorGail Schulz, NP  diphenhydrAMINE (BENADRYL) 25 MG tablet Take 25 mg by mouth every 6 (six) hours as needed (vomitting).    Historical Provider, MD  fexofenadine (ALLEGRA) 180 MG tablet Take 180 mg by mouth daily as needed for allergies.     Historical Provider, MD  HYDROcodone-acetaminophen (NORCO/VICODIN) 5-325 MG tablet Take 1-2 tablets by mouth every 6 (six) hours as needed for severe pain. 06/30/15   Danelle BerryLeisa Tapia, PA-C  ibuprofen (ADVIL,MOTRIN) 800 MG tablet Take 1 tablet (800 mg total) by mouth 3 (three) times daily. 06/30/15   Danelle Berry, PA-C  ondansetron (ZOFRAN) 4 MG tablet Take 1 tablet (4 mg total) by mouth every 8 (eight) hours as needed for nausea or vomiting. 05/30/15   [name], MD  traMADol (ULTRAM) 50 MG tablet Take 1 tablet (50 mg total) by mouth every 6 (six) hours as needed. 07/06/15   Earley Favor, NP   BP 131/76 mmHg  Pulse 85  Temp(Src) 98.2 F (36.8 C) (Oral)  Resp 14  SpO2 99%  LMP 06/30/2015 Physical Exam  Constitutional: She is oriented to person, place, and time.   HENT:  Right Ear: External ear normal.  Left Ear: External ear normal.  Nose: Nose normal.  Mouth/Throat: Oropharynx is clear and moist. No oropharyngeal exudate.  Eyes: Conjunctivae and EOM are normal. Pupils are equal, round, and reactive to light.  Neck: Normal range of motion. Neck supple.  Cardiovascular: Normal rate, regular rhythm, normal heart sounds and intact distal pulses.   Pulmonary/Chest: Effort normal and breath sounds normal. No respiratory distress. She has no wheezes. She has no rales. She exhibits no tenderness.  Abdominal: Soft. Bowel sounds are normal. She exhibits no distension. There is no tenderness. There is no rebound and no guarding.  Musculoskeletal: She exhibits no edema.  Neurological: She is alert and oriented to person, place, and time. No cranial nerve deficit.  Skin: Skin is warm and dry.  Psychiatric: She has a normal mood and affect.  Nursing note and vitals reviewed.  Filed Vitals:   07/24/15 1300 07/24/15 1419  BP: 119/91 131/76  Pulse: 117 85  Temp: 98.2 F (36.8 C)   TempSrc: Oral   Resp:  14  SpO2: 98% 99%     ED Course  Procedures (including critical care time) Labs Review Labs Reviewed  CBC WITH DIFFERENTIAL/PLATELET - Abnormal; Notable for the following:    WBC 11.1 (*)    Neutro Abs 8.3 (*)    All other components within normal limits  COMPREHENSIVE METABOLIC PANEL - Abnormal; Notable for the following:    Potassium 3.4 (*)    CO2 21 (*)    Total Protein 9.5 (*)    Albumin 5.2 (*)    ALT 13 (*)    All other components within normal limits  D-DIMER, QUANTITATIVE (NOT AT [facility])  TROPONIN I  I-STAT BETA HCG BLOOD, ED ([facility], [facility], [facility] ONLY)    Imaging Review Dg Chest 2 View  07/24/2015  CLINICAL DATA:  Intermittent episodes of sharp chest pain and shortness of breath lack lasting 10-15 minutes usually triggered by exertion semi: Syncopal episode yesterday; onset of symptoms 1 week ago. History of asthma. EXAM: CHEST  2 VIEW  COMPARISON:  PA and lateral chest x-ray of May 30, 2015. FINDINGS: The lungs are adequately inflated and clear. The heart and pulmonary vascularity are normal. The mediastinum is normal in width. There is no pleural effusion or pneumothorax. The bony thorax is unremarkable. IMPRESSION: There is no active cardiopulmonary disease. Electronically Signed   By: David  [place] M.D.   On: 07/24/2015 15:25   I have personally reviewed and evaluated these images and lab results as part of my medical decision-making.   EKG Interpretation   Date/Time:  Tuesday July 24 2015 15:45:07 EDT Ventricular Rate:  70 PR Interval:  124 QRS Duration: 87 QT Interval:  401 QTC Calculation: 433 R Axis:   68 Text Interpretation:  Sinus rhythm No significant change was found  Confirmed by CAMPOS  MD, Caryn Bee ([id number]) on 07/24/2015 3:50:44 PM      MDM   Final diagnoses:  Shortness of breath  Atypical chest pain    Workup unremarkable. Unclear etiology of chest pain and SOB. At this point have extremely low suspicion for ACS or PE given workup. Pt has no hypoxia. Her tachycardia is improved. Discussed with pt possible etiologies include anxiety/stress, GERD, or msk pain. Rx for albuterol given as pt ran out at home. INstructed to establish PCP (pt has been trying to establish) when possible for follow up. ER return precautions given.    Carlene Coria, PA-C 07/24/15 2331  Donnetta Hutching, MD 07/24/15 [phone]

## 2015-07-24 NOTE — ED Notes (Signed)
Patient is alert and oriented x3.  She was given DC instructions and follow up visit instructions.  Patient gave verbal understanding. She was DC ambulatory under her own power to home.  V/S stable.  He was not showing any signs of distress on DC 

## 2015-07-24 NOTE — ED Notes (Signed)
Pt taken to xray 

## 2015-07-24 NOTE — ED Notes (Signed)
Unable to collect labs patient in xray 

## 2015-07-24 NOTE — Discharge Instructions (Signed)
You were seen in the emergency room today for evaluation of chest pain and shortness of breath. Your bloodwork (including pregnancy test) were negative. Your chest x-ray was normal. Your chest pain is not likely due to an emergent cause. It could be musculoskeletal, due to stress/anxiety, or even heartburn. I will give you a refill of your albuterol inhaler for asthma exacerbations.  Return to the emergency room for worsening condition or new concerning symptoms. Follow up with your regular doctor. If you don't have a regular doctor use one of the numbers below to establish a primary care doctor.   Emergency Department Resource Guide 1) Find a Doctor and Pay Out of Pocket Although you won't have to find out who is covered by your insurance plan, it is a good idea to ask around and get recommendations. You will then need to call the office and see if the doctor you have chosen will accept you as a new patient and what types of options they offer for patients who are self-pay. Some doctors offer discounts or will set up payment plans for their patients who do not have insurance, but you will need to ask so you aren't surprised when you get to your appointment.  2) Contact Your Local Health Department Not all health departments have doctors that can see patients for sick visits, but many do, so it is worth a call to see if yours does. If you don't know where your local health department is, you can check in your phone book. The CDC also has a tool to help you locate your state's health department, and many state websites also have listings of all of their local health departments.  3) Find a Walk-in Clinic If your illness is not likely to be very severe or complicated, you may want to try a walk in clinic. These are popping up all over the country in pharmacies, drugstores, and shopping centers. They're usually staffed by nurse practitioners or physician assistants that have been trained to treat common  illnesses and complaints. They're usually fairly quick and inexpensive. However, if you have serious medical issues or chronic medical problems, these are probably not your best option.  No Primary Care Doctor: - Call Health Connect at  [phone] - they can help you locate a primary care doctor that  accepts your insurance, provides certain services, etc. - Physician Referral [phone]  Emergency Department Resource Guide 1) Find a Doctor and Pay Out of Pocket Although you won't have to find out who is covered by your insurance plan, it is a good idea to ask around and get recommendations. You will then need to call the office and see if the doctor you have chosen will accept you as a new patient and what types of options they offer for patients who are self-pay. Some doctors offer discounts or will set up payment plans for their patients who do not have insurance, but you will need to ask so you aren't surprised when you get to your appointment.  2) Contact Your Local Health Department Not all health departments have doctors that can see patients for sick visits, but many do, so it is worth a call to see if yours does. If you don't know where your local health department is, you can check in your phone book. The CDC also has a tool to help you locate your state's health department, and many state websites also have listings of all of their local health departments.  3) Find  a Walk-in Clinic If your illness is not likely to be very severe or complicated, you may want to try a walk in clinic. These are popping up all over the country in pharmacies, drugstores, and shopping centers. They're usually staffed by nurse practitioners or physician assistants that have been trained to treat common illnesses and complaints. They're usually fairly quick and inexpensive. However, if you have serious medical issues or chronic medical problems, these are probably not your best option.  No Primary Care  Doctor: - Call Health Connect at  [phone] - they can help you locate a primary care doctor that  accepts your insurance, provides certain services, etc. - Physician Referral Service- [phone]  Chronic Pain Problems: Organization         Address  Phone   Notes  [facility]  [phone] Patients need to be referred by their primary care doctor.   Medication Assistance: Organization         Address  Phone   Notes  [facility] Medication [facility] [address] [place], [place] [address] [phone] --Must be a resident of [facility] -- Must have NO insurance coverage whatsoever (no Medicaid/ Medicare, etc.) -- The pt. MUST have a primary care doctor that directs their care regularly and follows them in the community   [facility]  [phone]   [employer]  [phone]    Agencies that provide inexpensive medical care: Organization         Address  Phone   Notes  [facility]  [phone]   [facility]    [phone]   [facility] [address] [place], [place] [address] [phone]   [facility] [address] [place] [phone]   [facility]    [phone]   [facility]    [phone]   [facility]  [address], [place] Phone:  [phone], Fax:  [phone] Hours of Operation:  9 am - 6 pm, M-F.  Also accepts Medicaid/Medicare and self-pay.  [facility]  [address], [place] Phone: [phone], Fax: [phone]. Hours of Operation:  8:30 am - 5:30 pm, M-F.  Also accepts Medicaid and self-pay.  [facility] [place] [address] [place] Phone: [phone]   [facility] [address] Natasha Bence [place], [place] [phone], Ext. [phone] Mondays & Thursdays: 7-9 AM.  First 15 patients are seen on a first  come, first serve basis.    Medicaid-accepting [facility] Providers:  Organization         Address  Phone   Notes  [facility] [address], [place] [phone] Also accepts self-pay patients.  [facility] [address] Laurell Josephs [place], [place]  [phone]   [facility] [address], [place] [phone]   [facility] [address] [place] [phone]   Renaye Rakers [address], [place]   [phone] Only accepts [place] patients after they have their name applied to their card.   Self-Pay (no insurance) in [facility]:  Organization         Address  Phone   Notes  Sickle Cell Patients, [facility] [address], [place] [phone](336) [phone]   [facility] [address], [place] [phone](336) [phone]   [facility] [place]  [address], [place] [phone]   [facility]/Dr. Osei-Bonsu  [address] [place] [address], [place] or [phone], [place] [phone] Phone number for both [place] and [place] locations is the same.  [facility] [address], [place] [phone]) [phone]   [facility] [place] [address] [place] [address], [place] or [address] [phone]   [facility] [address], [place] [phone](336) [phone], phone; [phone], fax Sees patients 1st and 3rd Saturday of every month.  Must not qualify for public or private insurance (i.e. Medicaid, Medicare, [place] Health Choice, Veterans' Benefits)  Household income should be no more than 200% of the poverty level The clinic cannot treat you if you are pregnant or think you are pregnant  Sexually transmitted diseases are not treated at the clinic.

## 2015-12-12 ENCOUNTER — Emergency Department (HOSPITAL_COMMUNITY)
Admission: EM | Admit: 2015-12-12 | Discharge: 2015-12-12 | Disposition: A | Discharge status: Home / Self Care | Payer: Medicaid Other | Attending: Emergency Medicine | Admitting: Emergency Medicine

## 2015-12-12 ENCOUNTER — Encounter (HOSPITAL_COMMUNITY): Payer: Self-pay | Admitting: Emergency Medicine

## 2015-12-12 ENCOUNTER — Emergency Department (HOSPITAL_COMMUNITY): Payer: Medicaid Other

## 2015-12-12 DIAGNOSIS — Z79899 Other long term (current) drug therapy: Secondary | ICD-10-CM | POA: Diagnosis not present

## 2015-12-12 DIAGNOSIS — Z87891 Personal history of nicotine dependence: Secondary | ICD-10-CM | POA: Insufficient documentation

## 2015-12-12 DIAGNOSIS — J45909 Unspecified asthma, uncomplicated: Secondary | ICD-10-CM | POA: Insufficient documentation

## 2015-12-12 DIAGNOSIS — R1030 Lower abdominal pain, unspecified: Secondary | ICD-10-CM | POA: Diagnosis present

## 2015-12-12 DIAGNOSIS — Z791 Long term (current) use of non-steroidal anti-inflammatories (NSAID): Secondary | ICD-10-CM | POA: Diagnosis not present

## 2015-12-12 DIAGNOSIS — N739 Female pelvic inflammatory disease, unspecified: Secondary | ICD-10-CM | POA: Diagnosis not present

## 2015-12-12 DIAGNOSIS — N730 Acute parametritis and pelvic cellulitis: Secondary | ICD-10-CM

## 2015-12-12 LAB — COMPREHENSIVE METABOLIC PANEL
ALK PHOS: 80 U/L (ref 38–126)
ALT: 9 U/L — AB (ref 14–54)
ANION GAP: 5 (ref 5–15)
AST: 17 U/L (ref 15–41)
Albumin: 4.5 g/dL (ref 3.5–5.0)
BUN: 13 mg/dL (ref 6–20)
CALCIUM: 9.1 mg/dL (ref 8.9–10.3)
CHLORIDE: 108 mmol/L (ref 101–111)
CO2: 24 mmol/L (ref 22–32)
CREATININE: 0.74 mg/dL (ref 0.44–1.00)
Glucose, Bld: 101 mg/dL — ABNORMAL HIGH (ref 65–99)
Potassium: 3.8 mmol/L (ref 3.5–5.1)
Sodium: 137 mmol/L (ref 135–145)
Total Bilirubin: 0.3 mg/dL (ref 0.3–1.2)
Total Protein: 8.4 g/dL — ABNORMAL HIGH (ref 6.5–8.1)

## 2015-12-12 LAB — URINALYSIS, ROUTINE W REFLEX MICROSCOPIC
Bilirubin Urine: NEGATIVE
GLUCOSE, UA: NEGATIVE mg/dL
KETONES UR: NEGATIVE mg/dL
Nitrite: NEGATIVE
PROTEIN: NEGATIVE mg/dL
Specific Gravity, Urine: 1.024 (ref 1.005–1.030)
pH: 6 (ref 5.0–8.0)

## 2015-12-12 LAB — I-STAT BETA HCG BLOOD, ED (MC, WL, AP ONLY): I-stat hCG, quantitative: <5 m[IU]/mL (ref <5)

## 2015-12-12 LAB — CBC
HCT: 41.2 % (ref 36.0–46.0)
HEMOGLOBIN: 13.6 g/dL (ref 12.0–15.0)
MCH: 30.1 pg (ref 26.0–34.0)
MCHC: 33 g/dL (ref 30.0–36.0)
MCV: 91.2 fL (ref 78.0–100.0)
PLATELETS: 213 10*3/uL (ref 150–400)
RBC: 4.52 MIL/uL (ref 3.87–5.11)
RDW: 13.9 % (ref 11.5–15.5)
WBC: 6 10*3/uL (ref 4.0–10.5)

## 2015-12-12 LAB — WET PREP, GENITAL
Clue Cells Wet Prep HPF POC: NONE SEEN
SPERM: NONE SEEN
Trich, Wet Prep: NONE SEEN
Yeast Wet Prep HPF POC: NONE SEEN

## 2015-12-12 LAB — URINE MICROSCOPIC-ADD ON

## 2015-12-12 LAB — LIPASE, BLOOD: LIPASE: 21 U/L (ref 11–51)

## 2015-12-12 MED ORDER — TRAMADOL HCL 50 MG PO TABS: 50.0000 mg | ORAL_TABLET | Freq: Four times a day (QID) | ORAL | 0 refills | Status: DC | PRN

## 2015-12-12 MED ORDER — OXYCODONE-ACETAMINOPHEN 5-325 MG PO TABS
1.0000 | ORAL_TABLET | Freq: Once | ORAL | Status: DC
  Filled 2015-12-12: qty 1

## 2015-12-12 MED ORDER — OXYCODONE-ACETAMINOPHEN 5-325 MG PO TABS
1.0000 | ORAL_TABLET | Freq: Once | ORAL | Status: AC
  Administered 2015-12-12: 1 via ORAL

## 2015-12-12 MED ORDER — CEFTRIAXONE SODIUM 250 MG IJ SOLR
250.0000 mg | Freq: Once | INTRAMUSCULAR | Status: AC
  Administered 2015-12-12: 250 mg via INTRAMUSCULAR
  Filled 2015-12-12: qty 250

## 2015-12-12 MED ORDER — DOXYCYCLINE HYCLATE 100 MG PO CAPS: 100.0000 mg | ORAL_CAPSULE | Freq: Two times a day (BID) | ORAL | 0 refills | Status: DC

## 2015-12-12 MED ORDER — AZITHROMYCIN 250 MG PO TABS
1000.0000 mg | ORAL_TABLET | Freq: Once | ORAL | Status: AC
  Administered 2015-12-12: 1000 mg via ORAL
  Filled 2015-12-12: qty 4

## 2015-12-12 MED ORDER — STERILE WATER FOR INJECTION IJ SOLN
INTRAMUSCULAR | Status: AC
  Administered 2015-12-12: 10 mL
  Filled 2015-12-12: qty 10

## 2015-12-12 NOTE — ED Notes (Signed)
Patient transported to Ultrasound 

## 2015-12-12 NOTE — Discharge Instructions (Signed)
°  We treated you for gonorrhea and chlamydia infection while you were in the ER We suspected that you have PID.  PLEASE PRACTICE SAFE SEX. PLEASE INFORM YOUR PARTNER TO GET CHECKED AND TREATED FOR STD AS WELL.  [facility]

## 2015-12-12 NOTE — ED Notes (Signed)
Patient was alert, oriented and stable upon discharge. RN went over AVS and patient had no further questions. Pt was educated about the importance of not driving on narcotic pain meds.

## 2015-12-12 NOTE — ED Notes (Signed)
Pelvic cart at bedside. 

## 2015-12-12 NOTE — ED Notes (Signed)
Patient is aware we need urine 

## 2015-12-12 NOTE — ED Provider Notes (Signed)
[facility]-EMERGENCY DEPT Provider Note   CSN: [id number] Arrival date & time: 12/12/15  1108     History   Chief Complaint Chief Complaint  Patient presents with  . Abdominal Pain  . Vaginal Bleeding    HPI Tracey Lam is a 20 y.o. female.  HPI Pt comes in with abd pain, discharge and vaginal bleeding. Vaginal discharge started last week, and it is yellow, foul smelling.  Abd pain feels like cramps and is in the lower part of her abdomen. The abd pain started this  Morning. PT also has heavy bleeding starting yday. Her LMP was 2 weeks ago. She has used 3 pads today. No clots passing.  Gyne hx: PT has never had STD. Pt did have unprotected intercourse with 1 partner until 2 months ago. No hx of pelvic disorders. OB hx: G0P0 Past Medical History:  Diagnosis Date  . Asthma     There are no active problems to display for this patient.   History reviewed. No pertinent surgical history.  OB History    No data available       Home Medications    Prior to Admission medications   Medication Sig Start Date End Date Taking? Authorizing Provider  Phenazopyridine HCl (AZO-STANDARD PO) Take 1 tablet by mouth 4 (four) times daily as needed (urinary pain).   Yes Historical Provider, MD  albuterol (PROVENTIL HFA;VENTOLIN HFA) 108 (90 Base) MCG/ACT inhaler Inhale 1-2 puffs into the lungs every 6 (six) hours as needed for wheezing or shortness of breath. Patient not taking: Reported on 12/12/2015 07/24/15   Ace GinsSerena Y Sam, PA-C  cyclobenzaprine (FLEXERIL) 5 MG tablet Take 1 tablet (5 mg total) by mouth 3 (three) times daily. Patient not taking: Reported on 12/12/2015 07/06/15   Earley FavorGail Schulz, NP  doxycycline (VIBRAMYCIN) 100 MG capsule Take 1 capsule (100 mg total) by mouth 2 (two) times daily. 12/12/15   Derwood KaplanAnkit Kristin Barcus, MD  HYDROcodone-acetaminophen (NORCO/VICODIN) 5-325 MG tablet Take 1-2 tablets by mouth every 6 (six) hours as needed for severe pain. Patient not taking: Reported on 12/12/2015  06/30/15   Danelle BerryLeisa Tapia, PA-C  ibuprofen (ADVIL,MOTRIN) 800 MG tablet Take 1 tablet (800 mg total) by mouth 3 (three) times daily. Patient not taking: Reported on 12/12/2015 06/30/15   Danelle BerryLeisa Tapia, PA-C  ondansetron (ZOFRAN) 4 MG tablet Take 1 tablet (4 mg total) by mouth every 8 (eight) hours as needed for nausea or vomiting. Patient not taking: Reported on 12/12/2015 05/30/15   [name], MD  potassium chloride 20 MEQ TBCR Take 40 mEq by mouth daily. Patient not taking: Reported on 12/12/2015 07/24/15   Ace GinsSerena Y Sam, PA-C  traMADol (ULTRAM) 50 MG tablet Take 1 tablet (50 mg total) by mouth every 6 (six) hours as needed. 12/12/15   Derwood KaplanAnkit Toney Lizaola, MD    Family History No family history on file.  Social History Social History  Substance Use Topics  . Smoking status: Former Games developermoker  . Smokeless tobacco: Never Used  . Alcohol use No     Allergies   Review of patient's allergies indicates no known allergies.   Review of Systems Review of Systems  ROS 10 Systems reviewed and are negative for acute change except as noted in the HPI.     Physical Exam Updated Vital Signs BP 129/80 (BP Location: Left Arm)   Pulse 76   Temp 99.2 F (37.3 C) (Oral)   Resp 20   Ht 4\' 11"  (1.499 m)   Wt 125 lb (56.7  kg)   LMP 11/28/2015   SpO2 100%   BMI 25.25 kg/m   Physical Exam  Constitutional: She is oriented to person, place, and time. She appears well-developed.  HENT:  Head: Normocephalic and atraumatic.  Eyes: Conjunctivae and EOM are normal. Pupils are equal, round, and reactive to light.  Neck: Normal range of motion. Neck supple.  Cardiovascular: Normal rate, regular rhythm, normal heart sounds and intact distal pulses.   No murmur heard. Pulmonary/Chest: Effort normal. No respiratory distress. She has no wheezes.  Abdominal: Soft. Bowel sounds are normal. She exhibits no distension and no mass. There is tenderness. There is no rebound and no guarding.  Generalized  tenderness, worst in the lower quadrants  Genitourinary: Vagina normal and uterus normal.  Genitourinary Comments: External exam - normal, no lesions Speculum exam: Pt has some white discharge, and blood Bimanual exam: Patient has CMT, no adnexal tenderness or fullness and cervical os is closed  Neurological: She is alert and oriented to person, place, and time.  Skin: Skin is warm and dry.  Nursing note and vitals reviewed.    ED Treatments / Results  Labs (all labs ordered are listed, but only abnormal results are displayed) Labs Reviewed  WET PREP, GENITAL - Abnormal; Notable for the following:       Result Value   WBC, Wet Prep HPF POC MANY (*)    All other components within normal limits  COMPREHENSIVE METABOLIC PANEL - Abnormal; Notable for the following:    Glucose, Bld 101 (*)    Total Protein 8.4 (*)    ALT 9 (*)    All other components within normal limits  URINALYSIS, ROUTINE W REFLEX MICROSCOPIC (NOT AT [facility]) - Abnormal; Notable for the following:    APPearance CLOUDY (*)    Hgb urine dipstick LARGE (*)    Leukocytes, UA MODERATE (*)    All other components within normal limits  URINE MICROSCOPIC-ADD ON - Abnormal; Notable for the following:    Squamous Epithelial / LPF 0-5 (*)    Bacteria, UA MANY (*)    All other components within normal limits  LIPASE, BLOOD  CBC  HIV ANTIBODY (ROUTINE TESTING)  I-STAT BETA HCG BLOOD, ED ([facility], [facility], [facility] ONLY)  GC/CHLAMYDIA PROBE AMP ([place]) NOT AT [facility]    EKG  EKG Interpretation None       Radiology US Transvaginal Non-ob  Result Date: 12/12/2015 CLINICAL DATA:  Pelvic pain for 1 day. EXAM: TRANSABDOMINAL AND TRANSVAGINAL ULTRASOUND OF PELVIS TECHNIQUE: Both transabdominal and transvaginal ultrasound examinations of the pelvis were performed. Transabdominal technique was performed for global imaging of the pelvis including uterus, ovaries, adnexal regions, and pelvic cul-de-sac. It was necessary to proceed with  endovaginal exam following the transabdominal exam to visualize the uterus, endometrium, ovaries and adnexal regions. COMPARISON:  None. FINDINGS: Uterus Measurements: 7.7 x 4.8 x 4.3 cm. No fibroids or other mass visualized. Endometrium Thickness: 5 mm, within normal limits. No focal abnormality visualized. Right ovary Measurements: 3.1 x 2.1 x 2.5 cm. Normal appearance/no adnexal mass. Left ovary Measurements: 3.3 x 1.6 x 2.1 cm. Normal appearance/no adnexal mass. Other findings Small free fluid. IMPRESSION: Small free fluid.  Otherwise negative. Electronically Signed   By: Leanna Battles M.D.   On: 12/12/2015 16:04   US Pelvis Complete  Result Date: 12/12/2015 CLINICAL DATA:  Pelvic pain for 1 day. EXAM: TRANSABDOMINAL AND TRANSVAGINAL ULTRASOUND OF PELVIS TECHNIQUE: Both transabdominal and transvaginal ultrasound examinations of the pelvis were performed. Transabdominal  technique was performed for global imaging of the pelvis including uterus, ovaries, adnexal regions, and pelvic cul-de-sac. It was necessary to proceed with endovaginal exam following the transabdominal exam to visualize the uterus, endometrium, ovaries and adnexal regions. COMPARISON:  None. FINDINGS: Uterus Measurements: 7.7 x 4.8 x 4.3 cm. No fibroids or other mass visualized. Endometrium Thickness: 5 mm, within normal limits. No focal abnormality visualized. Right ovary Measurements: 3.1 x 2.1 x 2.5 cm. Normal appearance/no adnexal mass. Left ovary Measurements: 3.3 x 1.6 x 2.1 cm. Normal appearance/no adnexal mass. Other findings Small free fluid. IMPRESSION: Small free fluid.  Otherwise negative. Electronically Signed   By: Leanna Battles M.D.   On: 12/12/2015 16:04    Procedures Procedures (including critical care time)  Medications Ordered in ED Medications  cefTRIAXone (ROCEPHIN) injection 250 mg (250 mg Intramuscular Given 12/12/15 1522)  azithromycin (ZITHROMAX) tablet 1,000 mg (1,000 mg Oral Given 12/12/15 1521)  sterile  water (preservative free) injection (10 mLs  Given 12/12/15 1521)  oxyCODONE-acetaminophen (PERCOCET/ROXICET) 5-325 MG per tablet 1 tablet (1 tablet Oral Given 12/12/15 1816)     Initial Impression / Assessment and Plan / ED Course  I have reviewed the triage vital signs and the nursing notes.  Pertinent labs & imaging results that were available during my care of the patient were reviewed by me and considered in my medical decision making (see chart for details).  Clinical Course    Clinically pt has PID. [place] ruled out TOA and other causes for vaginal bleeding. ?DUB or PID related bleeding. Gyne g/u info given.  Final Clinical Impressions(s) / ED Diagnoses   Final diagnoses:  PID (acute pelvic inflammatory disease)    New Prescriptions Discharge Medication List as of 12/12/2015  6:13 PM    START taking these medications   Details  doxycycline (VIBRAMYCIN) 100 MG capsule Take 1 capsule (100 mg total) by mouth 2 (two) times daily., Starting Wed 12/12/2015, Print         Derwood Kaplan, MD 12/12/15 2127

## 2015-12-12 NOTE — ED Triage Notes (Signed)
Patient presents for lower abdominal pain x1 day, vaginal bleeding x1 day and increase vaginal discharge x1 week. Denies fever, N/V or urinary symptoms. A&O x4.

## 2015-12-13 LAB — HIV ANTIBODY (ROUTINE TESTING W REFLEX): HIV SCREEN 4TH GENERATION: NONREACTIVE

## 2015-12-13 LAB — GC/CHLAMYDIA PROBE AMP (~~LOC~~) NOT AT ARMC
CHLAMYDIA, DNA PROBE: NEGATIVE
NEISSERIA GONORRHEA: POSITIVE — AB

## 2015-12-14 ENCOUNTER — Telehealth (HOSPITAL_BASED_OUTPATIENT_CLINIC_OR_DEPARTMENT_OTHER): Payer: Self-pay | Admitting: Emergency Medicine
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
## 2017-10-08 ENCOUNTER — Encounter (HOSPITAL_COMMUNITY): Payer: Self-pay | Admitting: Emergency Medicine

## 2017-10-08 ENCOUNTER — Emergency Department (HOSPITAL_COMMUNITY)
Admission: EM | Admit: 2017-10-08 | Discharge: 2017-10-08 | Disposition: A | Discharge status: Home / Self Care | Payer: Self-pay | Attending: Emergency Medicine | Admitting: Emergency Medicine

## 2017-10-08 ENCOUNTER — Other Ambulatory Visit: Payer: Self-pay

## 2017-10-08 ENCOUNTER — Emergency Department (HOSPITAL_COMMUNITY): Payer: Self-pay

## 2017-10-08 DIAGNOSIS — Y92009 Unspecified place in unspecified non-institutional (private) residence as the place of occurrence of the external cause: Secondary | ICD-10-CM | POA: Insufficient documentation

## 2017-10-08 DIAGNOSIS — S60222A Contusion of left hand, initial encounter: Secondary | ICD-10-CM | POA: Insufficient documentation

## 2017-10-08 DIAGNOSIS — Y939 Activity, unspecified: Secondary | ICD-10-CM | POA: Insufficient documentation

## 2017-10-08 DIAGNOSIS — Z79899 Other long term (current) drug therapy: Secondary | ICD-10-CM | POA: Insufficient documentation

## 2017-10-08 DIAGNOSIS — W228XXA Striking against or struck by other objects, initial encounter: Secondary | ICD-10-CM | POA: Insufficient documentation

## 2017-10-08 DIAGNOSIS — J45909 Unspecified asthma, uncomplicated: Secondary | ICD-10-CM | POA: Insufficient documentation

## 2017-10-08 DIAGNOSIS — Z87891 Personal history of nicotine dependence: Secondary | ICD-10-CM | POA: Insufficient documentation

## 2017-10-08 DIAGNOSIS — Y999 Unspecified external cause status: Secondary | ICD-10-CM | POA: Insufficient documentation

## 2017-10-08 MED ORDER — NAPROXEN 375 MG PO TABS: 375.0000 mg | ORAL_TABLET | Freq: Two times a day (BID) | ORAL | 0 refills | Status: DC

## 2017-10-08 MED ORDER — IBUPROFEN 800 MG PO TABS
800.0000 mg | ORAL_TABLET | Freq: Once | ORAL | Status: AC
  Administered 2017-10-08: 800 mg via ORAL
  Filled 2017-10-08: qty 1

## 2017-10-08 MED ORDER — ACETAMINOPHEN 500 MG PO TABS
1000.0000 mg | ORAL_TABLET | Freq: Once | ORAL | Status: AC
  Administered 2017-10-08: 1000 mg via ORAL
  Filled 2017-10-08: qty 2

## 2017-10-08 NOTE — ED Notes (Signed)
Patient transported to X-ray 

## 2017-10-08 NOTE — ED Notes (Signed)
ED Provider at bedside. 

## 2017-10-08 NOTE — ED Provider Notes (Signed)
[facility]-EMERGENCY DEPT Provider Note   CSN: [id number] Arrival date & time: 10/08/17  0140     History   Chief Complaint Chief Complaint  Patient presents with  . Fall  . Hand Injury    HPI Tracey Lam is a 22 y.o. female.  The history is provided by the patient. No language interpreter was used.  Fall  This is a new problem. The current episode started 3 to 5 hours ago. The problem occurs constantly. The problem has not changed since onset.Pertinent negatives include no chest pain, no abdominal pain, no headaches and no shortness of breath. Nothing aggravates the symptoms. Nothing relieves the symptoms. She has tried nothing for the symptoms. The treatment provided no relief.  Hand Injury   The incident occurred 3 to 5 hours ago. The incident occurred at home. The injury mechanism was a fall. The pain is present in the left hand. The quality of the pain is described as aching. The pain is moderate. The pain has been constant since the incident. Pertinent negatives include no fever. She reports no foreign bodies present. The symptoms are aggravated by movement. She has tried nothing for the symptoms. The treatment provided no relief.  Was trying to get rid of a bee hive and fell backwards striking back on an electrical box.  No chest or abdominal pain.  No LOC no emesis.  No seizure like activity.  Most of her pain is in her left hand.    Past Medical History:  Diagnosis Date  . Asthma     There are no active problems to display for this patient.   History reviewed. No pertinent surgical history.   OB History   None      Home Medications    Prior to Admission medications   Medication Sig Start Date End Date Taking? Authorizing Provider  albuterol (PROVENTIL HFA;VENTOLIN HFA) 108 (90 Base) MCG/ACT inhaler Inhale 1-2 puffs into the lungs every 6 (six) hours as needed for wheezing or shortness of breath. Patient not taking: Reported on 12/12/2015  07/24/15   Sam, Ace Gins, PA-C  cyclobenzaprine (FLEXERIL) 5 MG tablet Take 1 tablet (5 mg total) by mouth 3 (three) times daily. Patient not taking: Reported on 12/12/2015 07/06/15   Earley Favor, NP  doxycycline (VIBRAMYCIN) 100 MG capsule Take 1 capsule (100 mg total) by mouth 2 (two) times daily. 12/12/15   Derwood Kaplan, MD  HYDROcodone-acetaminophen (NORCO/VICODIN) 5-325 MG tablet Take 1-2 tablets by mouth every 6 (six) hours as needed for severe pain. Patient not taking: Reported on 12/12/2015 06/30/15   Danelle Berry, PA-C  ibuprofen (ADVIL,MOTRIN) 800 MG tablet Take 1 tablet (800 mg total) by mouth 3 (three) times daily. Patient not taking: Reported on 12/12/2015 06/30/15   Danelle Berry, PA-C  ondansetron (ZOFRAN) 4 MG tablet Take 1 tablet (4 mg total) by mouth every 8 (eight) hours as needed for nausea or vomiting. Patient not taking: Reported on 12/12/2015 05/30/15   Mackuen, Cindee Salt, MD  Phenazopyridine HCl (AZO-STANDARD PO) Take 1 tablet by mouth 4 (four) times daily as needed (urinary pain).    [provider]  potassium chloride 20 MEQ TBCR Take 40 mEq by mouth daily. Patient not taking: Reported on 12/12/2015 07/24/15   Sam, Ace Gins, PA-C  traMADol (ULTRAM) 50 MG tablet Take 1 tablet (50 mg total) by mouth every 6 (six) hours as needed. 12/12/15   Derwood Kaplan, MD    Family History No family history on file.  Social History Social History   Tobacco Use  . Smoking status: Former Games developermoker  . Smokeless tobacco: Never Used  Substance Use Topics  . Alcohol use: Yes    Comment: Occassional   . Drug use: Yes    Types: Cocaine, Marijuana    Comment: pt reports she used last night      Allergies   Patient has no known allergies.   Review of Systems Review of Systems  Constitutional: Negative for fever.  Eyes: Negative for photophobia and visual disturbance.  Respiratory: Negative for shortness of breath.   Cardiovascular: Negative for chest pain.    Gastrointestinal: Negative for abdominal pain and vomiting.  Genitourinary: Negative for flank pain.  Musculoskeletal: Positive for arthralgias. Negative for joint swelling and neck pain.  Neurological: Negative for dizziness, tremors, seizures, syncope, facial asymmetry, speech difficulty, weakness, light-headedness, numbness and headaches.  All other systems reviewed and are negative.    Physical Exam Updated Vital Signs BP (!) 136/99 (BP Location: Right Arm)   Pulse 64   Temp 98.4 F (36.9 C) (Oral)   Resp 16   Ht 4\' 11"  (1.499 m)   Wt 51.3 kg (113 lb)   LMP 10/01/2017   SpO2 100%   BMI 22.82 kg/m   Physical Exam  Constitutional: She is oriented to person, place, and time. She appears well-developed and well-nourished. No distress.  HENT:  Head: Normocephalic and atraumatic. Head is without raccoon's eyes and without Battle's sign.  Right Ear: No mastoid tenderness. No hemotympanum.  Left Ear: No mastoid tenderness. No hemotympanum.  Nose: Nose normal.  Mouth/Throat: Oropharynx is clear and moist. No oropharyngeal exudate.  Eyes: Pupils are equal, round, and reactive to light. Conjunctivae are normal.  Neck: Normal range of motion. Neck supple.  Cardiovascular: Normal rate, regular rhythm, normal heart sounds and intact distal pulses.  No murmur heard. Pulmonary/Chest: Effort normal and breath sounds normal. No stridor. She has no wheezes. She has no rales.  Abdominal: Soft. Bowel sounds are normal. She exhibits no mass. There is no tenderness. There is no rebound and no guarding.  Musculoskeletal: Normal range of motion. She exhibits no edema, tenderness or deformity.       Left shoulder: Normal.       Left elbow: Normal.       Left wrist: Normal. She exhibits normal range of motion, no tenderness, no bony tenderness, no swelling, no effusion, no crepitus, no deformity and no laceration.       Right knee: Normal.       Left knee: Normal.       Cervical back: Normal.        Thoracic back: Normal.       Lumbar back: Normal.       Left upper arm: Normal.       Left forearm: Normal.       Left hand: Normal. She exhibits normal capillary refill. Normal sensation noted. Normal strength noted. She exhibits no finger abduction and no wrist extension trouble.  Neurological: She is alert and oriented to person, place, and time. She displays normal reflexes. No sensory deficit. She exhibits normal muscle tone. Coordination normal.  5/5 in all for sensation intact in all 4 extremities, exam performed with nurse present  Skin: Skin is warm and dry. Capillary refill takes less than 2 seconds.  Psychiatric: She has a normal mood and affect.     ED Treatments / Results   Radiology Dg Hand Complete Left  Result Date: 10/08/2017  CLINICAL DATA:  22 year old female with fall and trauma to the left hand. EXAM: LEFT HAND - COMPLETE 3+ VIEW COMPARISON:  None. FINDINGS: There is no acute fracture or dislocation. The bones are well mineralized. No arthritic changes. The soft tissues appear unremarkable. Dressing noted over the middle finger. IMPRESSION: Negative. Electronically Signed   By: Elgie Collard M.D.   On: 10/08/2017 02:42    Procedures Procedures (including critical care time)  Medications Ordered in ED Medications  acetaminophen (TYLENOL) tablet 1,000 mg (1,000 mg Oral Given 10/08/17 0504)  ibuprofen (ADVIL,MOTRIN) tablet 800 mg (800 mg Oral Given 10/08/17 0504)       Final Clinical Impressions(s) / ED Diagnoses   Return for weakness, numbness, changes in vision or speech, fevers >100.4 unrelieved by medication, shortness of breath, intractable vomiting, or diarrhea, abdominal pain, Inability to tolerate liquids or food, cough, altered mental status or any concerns. No signs of systemic illness or infection. The patient is nontoxic-appearing on exam and vital signs are within normal limits. Will refer to urology for microscopy hematuria as patient is  asymptomatic.  I have reviewed the triage vital signs and the nursing notes. Pertinent labs &imaging results that were available during my care of the patient were reviewed by me and considered in my medical decision making (see chart for details).  After history, exam, and medical workup I feel the patient has been appropriately medically screened and is safe for discharge home. Pertinent diagnoses were discussed with the patient. Patient was given return precautions.    Marciel Offenberger, MD 10/08/17 [phone]

## 2017-10-08 NOTE — ED Triage Notes (Signed)
Patient presents after a fall. Patient was attempting to get a bee hive off of her mother's house when she slipped and fell onto the electrical box. Patient complaining of back and neck pain and left hand pain. Patient states she hit the back of her head. Patient endorses marijuana use.

## 2017-10-08 NOTE — ED Notes (Signed)
Pt has sensation in all extremities

## 2017-10-13 ENCOUNTER — Emergency Department (HOSPITAL_COMMUNITY)
Admission: EM | Admit: 2017-10-13 | Discharge: 2017-10-13 | Disposition: A | Discharge status: Home / Self Care | Payer: Self-pay | Attending: Emergency Medicine | Admitting: Emergency Medicine

## 2017-10-13 ENCOUNTER — Ambulatory Visit (HOSPITAL_COMMUNITY): Payer: Self-pay

## 2017-10-13 ENCOUNTER — Encounter (HOSPITAL_COMMUNITY): Payer: Self-pay | Admitting: Emergency Medicine

## 2017-10-13 ENCOUNTER — Emergency Department (HOSPITAL_COMMUNITY): Payer: Self-pay

## 2017-10-13 DIAGNOSIS — M545 Low back pain, unspecified: Secondary | ICD-10-CM

## 2017-10-13 DIAGNOSIS — M79642 Pain in left hand: Secondary | ICD-10-CM

## 2017-10-13 DIAGNOSIS — Z79899 Other long term (current) drug therapy: Secondary | ICD-10-CM | POA: Insufficient documentation

## 2017-10-13 DIAGNOSIS — M542 Cervicalgia: Secondary | ICD-10-CM | POA: Insufficient documentation

## 2017-10-13 DIAGNOSIS — J45909 Unspecified asthma, uncomplicated: Secondary | ICD-10-CM | POA: Insufficient documentation

## 2017-10-13 DIAGNOSIS — Z87891 Personal history of nicotine dependence: Secondary | ICD-10-CM | POA: Insufficient documentation

## 2017-10-13 DIAGNOSIS — R519 Headache, unspecified: Secondary | ICD-10-CM

## 2017-10-13 DIAGNOSIS — R51 Headache: Secondary | ICD-10-CM | POA: Insufficient documentation

## 2017-10-13 LAB — PREGNANCY, URINE: Preg Test, Ur: NEGATIVE

## 2017-10-13 MED ORDER — CYCLOBENZAPRINE HCL 10 MG PO TABS
5.0000 mg | ORAL_TABLET | Freq: Once | ORAL | Status: AC
  Administered 2017-10-13: 5 mg via ORAL

## 2017-10-13 MED ORDER — ACETAMINOPHEN 500 MG PO TABS
1000.0000 mg | ORAL_TABLET | Freq: Once | ORAL | Status: AC
  Administered 2017-10-13: 1000 mg via ORAL
  Filled 2017-10-13: qty 2

## 2017-10-13 MED ORDER — CYCLOBENZAPRINE HCL 10 MG PO TABS
5.0000 mg | ORAL_TABLET | Freq: Once | ORAL | Status: DC
  Filled 2017-10-13 (×2): qty 1

## 2017-10-13 NOTE — ED Notes (Signed)
Pt attempting to obtain a urine sample  

## 2017-10-13 NOTE — ED Notes (Signed)
Pt calling out to nurses station stating " I am just going to walk out of here if yall dont bring me my d/c papers"

## 2017-10-13 NOTE — Discharge Instructions (Signed)
Please establish care with [facility] which is a free clinic. They are often backed up so please make an appointment when you can

## 2017-10-13 NOTE — ED Notes (Signed)
Pt provided with DC papers. Unable to obtain vitals or signature.

## 2017-10-13 NOTE — ED Triage Notes (Signed)
Patient here from home with complaints of fall on 6/27. Reports headache, left hand pain, and back pain. Seen for same, pain worse. ambulatory

## 2017-10-13 NOTE — ED Notes (Signed)
Pt back up to registration desk after being D/C'ed requesting a work note. Pt provided with work note.
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
## 2017-10-13 NOTE — ED Notes (Signed)
Pt made aware urine needed  

## 2017-10-13 NOTE — ED Notes (Signed)
Pt requesting to leave. Pt states she is hungry and tired. Pt offered food. Pt refused stating " I dont like what yall have" Pt states "that we havent done anything to help her and those meds aint working" PA made aware

## 2018-07-02 ENCOUNTER — Ambulatory Visit (HOSPITAL_COMMUNITY)
Admission: EM | Admit: 2018-07-02 | Discharge: 2018-07-02 | Disposition: A | Discharge status: Home / Self Care | Payer: Self-pay | Attending: Family Medicine | Admitting: Family Medicine

## 2018-07-02 ENCOUNTER — Other Ambulatory Visit: Payer: Self-pay

## 2018-07-02 DIAGNOSIS — N898 Other specified noninflammatory disorders of vagina: Secondary | ICD-10-CM | POA: Insufficient documentation

## 2018-07-02 DIAGNOSIS — Z202 Contact with and (suspected) exposure to infections with a predominantly sexual mode of transmission: Secondary | ICD-10-CM

## 2018-07-02 MED ORDER — METRONIDAZOLE 500 MG PO TABS: 500.0000 mg | ORAL_TABLET | Freq: Two times a day (BID) | ORAL | 0 refills | Status: DC

## 2018-07-02 MED ORDER — CEFTRIAXONE SODIUM 250 MG IJ SOLR
INTRAMUSCULAR | Status: AC
  Filled 2018-07-02: qty 250

## 2018-07-02 MED ORDER — AZITHROMYCIN 250 MG PO TABS
1000.0000 mg | ORAL_TABLET | Freq: Once | ORAL | Status: AC
  Administered 2018-07-02: 1000 mg via ORAL

## 2018-07-02 MED ORDER — CEFTRIAXONE SODIUM 250 MG IJ SOLR
250.0000 mg | Freq: Once | INTRAMUSCULAR | Status: AC
  Administered 2018-07-02: 250 mg via INTRAMUSCULAR

## 2018-07-02 MED ORDER — AZITHROMYCIN 250 MG PO TABS
ORAL_TABLET | ORAL | Status: AC
  Filled 2018-07-02: qty 4

## 2018-07-02 MED ORDER — FLUCONAZOLE 150 MG PO TABS: 150.0000 mg | ORAL_TABLET | Freq: Every day | ORAL | 0 refills | Status: DC

## 2018-07-02 NOTE — Discharge Instructions (Addendum)
We are testing you for STDs.  Treating for gonorrhea, chlamydia, trichomonas, bacterial vaginosis and yeast. We will call you with any positive test results.  Follow up as needed for continued or worsening symptoms
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
## 2018-07-02 NOTE — ED Triage Notes (Signed)
Pt presents to [facility] for assessment of white discharge and foul vaginal odor x 2 months.

## 2018-07-03 LAB — HIV ANTIBODY (ROUTINE TESTING W REFLEX): HIV SCREEN 4TH GENERATION: NONREACTIVE

## 2018-07-03 LAB — RPR: RPR: NONREACTIVE

## 2018-07-05 ENCOUNTER — Telehealth (HOSPITAL_COMMUNITY): Payer: Self-pay | Admitting: Emergency Medicine

## 2018-07-05 LAB — CERVICOVAGINAL ANCILLARY ONLY
Bacterial vaginitis: POSITIVE — AB
CANDIDA VAGINITIS: NEGATIVE
CHLAMYDIA, DNA PROBE: NEGATIVE
Neisseria Gonorrhea: POSITIVE — AB
Trichomonas: NEGATIVE

## 2018-07-05 NOTE — Telephone Encounter (Signed)
Test for gonorrhea was positive. This was treated at the urgent care visit with IM rocephin 250mg  and po zithromax 1g. Pt needs education to refrain from sexual intercourse for 7 days after treatment to give the medicine time to work. Sexual partners need to be notified and tested/treated. Condoms may reduce risk of reinfection. Recheck or followup with PCP for further evaluation if symptoms are not improving. [facility] notified.   Bacterial Vaginosis test is positive.  Prescription for metronidazole was given at the urgent care visit. Pt contacted regarding results. Answered all questions. Verbalized understanding.  Spoke to pt verbalized understanding of results

## 2018-10-06 ENCOUNTER — Emergency Department (HOSPITAL_COMMUNITY)
Admission: EM | Admit: 2018-10-06 | Discharge: 2018-10-06 | Disposition: A | Discharge status: Home / Self Care | Payer: Self-pay | Attending: Emergency Medicine | Admitting: Emergency Medicine

## 2018-10-06 ENCOUNTER — Encounter (HOSPITAL_COMMUNITY): Payer: Self-pay

## 2018-10-06 ENCOUNTER — Other Ambulatory Visit: Payer: Self-pay

## 2018-10-06 ENCOUNTER — Emergency Department (HOSPITAL_COMMUNITY): Payer: Self-pay

## 2018-10-06 DIAGNOSIS — J45909 Unspecified asthma, uncomplicated: Secondary | ICD-10-CM | POA: Insufficient documentation

## 2018-10-06 DIAGNOSIS — Z87891 Personal history of nicotine dependence: Secondary | ICD-10-CM | POA: Insufficient documentation

## 2018-10-06 DIAGNOSIS — F1123 Opioid dependence with withdrawal: Secondary | ICD-10-CM | POA: Insufficient documentation

## 2018-10-06 DIAGNOSIS — F1193 Opioid use, unspecified with withdrawal: Secondary | ICD-10-CM

## 2018-10-06 DIAGNOSIS — R079 Chest pain, unspecified: Secondary | ICD-10-CM

## 2018-10-06 LAB — BASIC METABOLIC PANEL
Anion gap: 10 (ref 5–15)
BUN: 14 mg/dL (ref 6–20)
CO2: 21 mmol/L — ABNORMAL LOW (ref 22–32)
Calcium: 9.2 mg/dL (ref 8.9–10.3)
Chloride: 102 mmol/L (ref 98–111)
Creatinine, Ser: 0.59 mg/dL (ref 0.44–1.00)
GFR calc Af Amer: >60 mL/min (ref >60)
GFR calc non Af Amer: >60 mL/min (ref >60)
Glucose, Bld: 106 mg/dL — ABNORMAL HIGH (ref 70–99)
Potassium: 3.8 mmol/L (ref 3.5–5.1)
Sodium: 133 mmol/L — ABNORMAL LOW (ref 135–145)

## 2018-10-06 LAB — CBC
HCT: 41.7 % (ref 36.0–46.0)
Hemoglobin: 13.8 g/dL (ref 12.0–15.0)
MCH: 30.1 pg (ref 26.0–34.0)
MCHC: 33.1 g/dL (ref 30.0–36.0)
MCV: 91 fL (ref 80.0–100.0)
Platelets: 259 10*3/uL (ref 150–400)
RBC: 4.58 MIL/uL (ref 3.87–5.11)
RDW: 13.4 % (ref 11.5–15.5)
WBC: 7.6 10*3/uL (ref 4.0–10.5)
nRBC: 0 % (ref 0.0–0.2)

## 2018-10-06 MED ORDER — HYDROXYZINE HCL 25 MG PO TABS
25.0000 mg | ORAL_TABLET | Freq: Once | ORAL | Status: AC
  Administered 2018-10-06: 25 mg via ORAL
  Filled 2018-10-06: qty 1

## 2018-10-06 MED ORDER — HYDROXYZINE HCL 25 MG PO TABS: 25.0000 mg | ORAL_TABLET | Freq: Four times a day (QID) | ORAL | 0 refills | Status: DC

## 2018-10-06 MED ORDER — NAPROXEN 500 MG PO TABS
500.0000 mg | ORAL_TABLET | Freq: Once | ORAL | Status: AC
  Administered 2018-10-06: 500 mg via ORAL
  Filled 2018-10-06: qty 1

## 2018-10-06 MED ORDER — DICYCLOMINE HCL 10 MG PO CAPS
10.0000 mg | ORAL_CAPSULE | Freq: Once | ORAL | Status: AC
  Administered 2018-10-06: 10 mg via ORAL
  Filled 2018-10-06: qty 1

## 2018-10-06 NOTE — ED Notes (Signed)
ED Provider at bedside. 
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
## 2018-10-06 NOTE — ED Notes (Signed)
An After Visit Summary was printed and given to the patient. No further questions at this time 

## 2018-10-06 NOTE — ED Notes (Signed)
Pt pulled off BP cuff and pulse ox cord. Pt allowed RN to put BP cuff and pulse ox back on.

## 2018-10-06 NOTE — Discharge Instructions (Signed)
Take the medications as directed. Return to the ED if you start to develop worsening symptoms, increased chest pain, shortness of breath, leg swelling, vomiting or coughing up blood.
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
## 2018-10-06 NOTE — ED Triage Notes (Signed)
Pt arrived with complaints of chest pain, shortness of breath. Pt stating she had a nose bleed earlier today. Pt awoke with chest pain around 0100 this morning. Pt reports throwing up one time. Pt also stating starting two week ago her hands have been numb.

## 2018-10-06 NOTE — ED Provider Notes (Signed)
  Physical Exam  BP 113/62   Pulse 78   Temp 97.8 F (36.6 C) (Oral)   Resp 18   Ht 4\' 11"  (1.499 m)   LMP 09/13/2018   SpO2 98%   BMI 22.82 kg/m   Physical Exam Vitals signs and nursing note reviewed.  Constitutional:      General: She is not in acute distress.    Appearance: She is well-developed. She is not diaphoretic.  HENT:     Head: Normocephalic and atraumatic.  Eyes:     General: No scleral icterus.    Conjunctiva/sclera: Conjunctivae normal.  Neck:     Musculoskeletal: Normal range of motion.  Pulmonary:     Effort: Pulmonary effort is normal. No respiratory distress.  Skin:    Findings: No rash.  Neurological:     Mental Status: She is alert.     ED Course/Procedures     Procedures  MDM  Care handed off from previous provider PA Humes.  Please see their note for further detail.  Briefly, patient is a 23 year old female with past medical history of asthma and opiate abuse who presents to the ED for chest pain.  Began this morning, describes it as constant with associated palpitations.  Denies fevers.  She last used opiates yesterday morning and was planning on "quitting cold [place]."  Lab work including CBC, BMP, EKG shows no acute or concerning findings.  Chest x-ray is pending.  She was given symptomatic treatment with naproxen, Bentyl and hydroxyzine.  Most likely discharge home if symptoms improve and imaging unremarkable.  8:01 AM Dg Chest 2 View  Result Date: 10/06/2018 CLINICAL DATA:  Chest pain. EXAM: CHEST - 2 VIEW COMPARISON:  Radiographs of July 24, 2015. FINDINGS: The heart size and mediastinal contours are within normal limits. Both lungs are clear. No pneumothorax or pleural effusion is noted. The visualized skeletal structures are unremarkable. IMPRESSION: No active cardiopulmonary disease. Electronically Signed   By: Marijo Conception M.D.   On: 10/06/2018 07:22    On recheck, patient resting comfortably, stating her symptoms have improved.   Chest x-ray is unremarkable will give prescription for hydroxyzine, have her follow-up with PCP and return for worsening symptoms.   Patient is hemodynamically stable, in NAD, and able to ambulate in the ED. Evaluation does not show pathology that would require ongoing emergent intervention or inpatient treatment. I explained the diagnosis to the patient. Pain has been managed and has no complaints prior to discharge. Patient is comfortable with above plan and is stable for discharge at this time. All questions were answered prior to disposition. Strict return precautions for returning to the ED were discussed. Encouraged follow up with PCP.   An After Visit Summary was printed and given to the patient.   Portions of this note were generated with Lobbyist. Dictation errors may occur despite best attempts at proofreading.        Delia Heady, PA-C 10/06/18 [id number]    Margette Fast, MD 10/06/18 2004

## 2018-10-06 NOTE — ED Notes (Signed)
While waiting for a room security made triage staff aware that pt was laying in the floor in the lobby. Pt found laying in lobby floor wrapped up in her blanket. Pt asked what happened and she states " I fainted and my chest hurts." Pt was then asked if she could stand up to sit in wheelchair and pt answered " No, my chest hurts".  Pt was then picked up via three person lift and was placed in wheelchair.Registration staff made triage RN and Tech aware that pt stood up and looked directly at her and laid down in the floor.Pt then wheeled to room 18. Pt then was able to stand and sit on the bed without assistance.

## 2019-12-04 ENCOUNTER — Ambulatory Visit (HOSPITAL_COMMUNITY)
Admission: EM | Admit: 2019-12-04 | Discharge: 2019-12-06 | Disposition: A | Discharge status: Home / Self Care | Payer: No Payment, Other | Attending: Behavioral Health | Admitting: Behavioral Health

## 2019-12-04 ENCOUNTER — Other Ambulatory Visit: Payer: Self-pay

## 2019-12-04 DIAGNOSIS — F19239 Other psychoactive substance dependence with withdrawal, unspecified: Secondary | ICD-10-CM | POA: Insufficient documentation

## 2019-12-04 DIAGNOSIS — F1420 Cocaine dependence, uncomplicated: Secondary | ICD-10-CM | POA: Insufficient documentation

## 2019-12-04 DIAGNOSIS — R45851 Suicidal ideations: Secondary | ICD-10-CM | POA: Insufficient documentation

## 2019-12-04 DIAGNOSIS — F1920 Other psychoactive substance dependence, uncomplicated: Secondary | ICD-10-CM | POA: Diagnosis present

## 2019-12-04 DIAGNOSIS — Z87891 Personal history of nicotine dependence: Secondary | ICD-10-CM | POA: Insufficient documentation

## 2019-12-04 DIAGNOSIS — F322 Major depressive disorder, single episode, severe without psychotic features: Secondary | ICD-10-CM | POA: Diagnosis present

## 2019-12-04 DIAGNOSIS — F1220 Cannabis dependence, uncomplicated: Secondary | ICD-10-CM | POA: Insufficient documentation

## 2019-12-04 DIAGNOSIS — Z791 Long term (current) use of non-steroidal anti-inflammatories (NSAID): Secondary | ICD-10-CM | POA: Insufficient documentation

## 2019-12-04 DIAGNOSIS — Z20822 Contact with and (suspected) exposure to covid-19: Secondary | ICD-10-CM | POA: Insufficient documentation

## 2019-12-04 DIAGNOSIS — R109 Unspecified abdominal pain: Secondary | ICD-10-CM | POA: Diagnosis not present

## 2019-12-04 DIAGNOSIS — J45909 Unspecified asthma, uncomplicated: Secondary | ICD-10-CM | POA: Insufficient documentation

## 2019-12-04 DIAGNOSIS — Z79899 Other long term (current) drug therapy: Secondary | ICD-10-CM | POA: Insufficient documentation

## 2019-12-04 DIAGNOSIS — Z3201 Encounter for pregnancy test, result positive: Secondary | ICD-10-CM | POA: Insufficient documentation

## 2019-12-04 DIAGNOSIS — F19939 Other psychoactive substance use, unspecified with withdrawal, unspecified: Secondary | ICD-10-CM | POA: Diagnosis present

## 2019-12-04 MED ORDER — ALUM & MAG HYDROXIDE-SIMETH 200-200-20 MG/5ML PO SUSP: 30.0000 mL | ORAL | Status: DC | PRN

## 2019-12-04 MED ORDER — ACETAMINOPHEN 325 MG PO TABS: 650.0000 mg | ORAL_TABLET | Freq: Four times a day (QID) | ORAL | Status: DC | PRN

## 2019-12-04 MED ORDER — HYDROXYZINE HCL 25 MG PO TABS
25.0000 mg | ORAL_TABLET | Freq: Four times a day (QID) | ORAL | Status: DC | PRN
  Administered 2019-12-05 (×4): 25 mg via ORAL
  Filled 2019-12-04 (×4): qty 1

## 2019-12-04 MED ORDER — MAGNESIUM HYDROXIDE 400 MG/5ML PO SUSP: 30.0000 mL | Freq: Every day | ORAL | Status: DC | PRN

## 2019-12-04 MED ORDER — TRAZODONE HCL 100 MG PO TABS
100.0000 mg | ORAL_TABLET | Freq: Every evening | ORAL | Status: DC | PRN
  Administered 2019-12-05: 100 mg via ORAL
  Filled 2019-12-04: qty 1
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
## 2019-12-04 NOTE — ED Notes (Signed)
Pt belongings in locker #11  

## 2019-12-05 ENCOUNTER — Encounter (HOSPITAL_COMMUNITY): Payer: Self-pay | Admitting: Emergency Medicine

## 2019-12-05 ENCOUNTER — Other Ambulatory Visit: Payer: Self-pay

## 2019-12-05 LAB — CBC WITH DIFFERENTIAL/PLATELET
Abs Immature Granulocytes: 0.05 10*3/uL (ref 0.00–0.07)
Basophils Absolute: 0 10*3/uL (ref 0.0–0.1)
Basophils Relative: 0 %
Eosinophils Absolute: 0 10*3/uL (ref 0.0–0.5)
Eosinophils Relative: 0 %
HCT: 41 % (ref 36.0–46.0)
Hemoglobin: 13.6 g/dL (ref 12.0–15.0)
Immature Granulocytes: 0 %
Lymphocytes Relative: 19 %
Lymphs Abs: 2.4 10*3/uL (ref 0.7–4.0)
MCH: 29.2 pg (ref 26.0–34.0)
MCHC: 33.2 g/dL (ref 30.0–36.0)
MCV: 88.2 fL (ref 80.0–100.0)
Monocytes Absolute: 1 10*3/uL (ref 0.1–1.0)
Monocytes Relative: 8 %
Neutro Abs: 8.9 10*3/uL — ABNORMAL HIGH (ref 1.7–7.7)
Neutrophils Relative %: 73 %
Platelets: 249 10*3/uL (ref 150–400)
RBC: 4.65 MIL/uL (ref 3.87–5.11)
RDW: 13.9 % (ref 11.5–15.5)
WBC: 12.5 10*3/uL — ABNORMAL HIGH (ref 4.0–10.5)
nRBC: 0 % (ref 0.0–0.2)

## 2019-12-05 LAB — COMPREHENSIVE METABOLIC PANEL
ALT: 11 U/L (ref 0–44)
AST: 16 U/L (ref 15–41)
Albumin: 4.2 g/dL (ref 3.5–5.0)
Alkaline Phosphatase: 80 U/L (ref 38–126)
Anion gap: 12 (ref 5–15)
BUN: <5 mg/dL — ABNORMAL LOW (ref 6–20)
CO2: 22 mmol/L (ref 22–32)
Calcium: 9.5 mg/dL (ref 8.9–10.3)
Chloride: 101 mmol/L (ref 98–111)
Creatinine, Ser: 0.51 mg/dL (ref 0.44–1.00)
GFR calc Af Amer: >60 mL/min (ref >60)
GFR calc non Af Amer: >60 mL/min (ref >60)
Glucose, Bld: 93 mg/dL (ref 70–99)
Potassium: 3.8 mmol/L (ref 3.5–5.1)
Sodium: 135 mmol/L (ref 135–145)
Total Bilirubin: 0.5 mg/dL (ref 0.3–1.2)
Total Protein: 7.9 g/dL (ref 6.5–8.1)

## 2019-12-05 LAB — LIPID PANEL
Cholesterol: 169 mg/dL (ref 0–200)
HDL: 57 mg/dL (ref >40)
LDL Cholesterol: 100 mg/dL — ABNORMAL HIGH (ref 0–99)
Total CHOL/HDL Ratio: 3 RATIO
Triglycerides: 61 mg/dL (ref <150)
VLDL: 12 mg/dL (ref 0–40)

## 2019-12-05 LAB — POCT URINE DRUG SCREEN - MANUAL ENTRY (I-SCREEN)
POC Amphetamine UR: NOT DETECTED
POC Buprenorphine (BUP): NOT DETECTED
POC Cocaine UR: NOT DETECTED
POC Marijuana UR: POSITIVE — AB
POC Methadone UR: NOT DETECTED
POC Methamphetamine UR: NOT DETECTED
POC Morphine: POSITIVE — AB
POC Oxazepam (BZO): NOT DETECTED
POC Oxycodone UR: NOT DETECTED
POC Secobarbital (BAR): NOT DETECTED

## 2019-12-05 LAB — POC SARS CORONAVIRUS 2 AG: SARS Coronavirus 2 Ag: NEGATIVE

## 2019-12-05 LAB — ETHANOL: Alcohol, Ethyl (B): <10 mg/dL (ref <10)

## 2019-12-05 LAB — SARS CORONAVIRUS 2 BY RT PCR (HOSPITAL ORDER, PERFORMED IN ~~LOC~~ HOSPITAL LAB): SARS Coronavirus 2: NEGATIVE

## 2019-12-05 LAB — TSH: TSH: 0.344 u[IU]/mL — ABNORMAL LOW (ref 0.350–4.500)

## 2019-12-05 LAB — GLUCOSE, CAPILLARY: Glucose-Capillary: 108 mg/dL — ABNORMAL HIGH (ref 70–99)

## 2019-12-05 LAB — HCG, SERUM, QUALITATIVE: Preg, Serum: POSITIVE — AB

## 2019-12-05 LAB — POCT PREGNANCY, URINE: Preg Test, Ur: POSITIVE — AB

## 2019-12-05 LAB — MAGNESIUM: Magnesium: 2.3 mg/dL (ref 1.7–2.4)

## 2019-12-05 LAB — POC SARS CORONAVIRUS 2 AG -  ED: SARS Coronavirus 2 Ag: NEGATIVE

## 2019-12-05 MED ORDER — ONDANSETRON 4 MG PO TBDP: 4.0000 mg | ORAL_TABLET | Freq: Four times a day (QID) | ORAL | Status: DC | PRN

## 2019-12-05 MED ORDER — LOPERAMIDE HCL 2 MG PO CAPS: 2.0000 mg | ORAL_CAPSULE | ORAL | Status: DC | PRN

## 2019-12-05 MED ORDER — DIPHENHYDRAMINE HCL 25 MG PO CAPS
25.0000 mg | ORAL_CAPSULE | Freq: Every evening | ORAL | Status: DC | PRN
  Administered 2019-12-05: 25 mg via ORAL
  Filled 2019-12-05: qty 1

## 2019-12-05 MED ORDER — NAPROXEN 500 MG PO TABS: 500.0000 mg | ORAL_TABLET | Freq: Two times a day (BID) | ORAL | Status: DC | PRN

## 2019-12-05 MED ORDER — CLONIDINE HCL 0.1 MG PO TABS
0.1000 mg | ORAL_TABLET | Freq: Four times a day (QID) | ORAL | Status: DC
  Administered 2019-12-05 – 2019-12-06 (×2): 0.1 mg via ORAL
  Filled 2019-12-05 (×2): qty 1

## 2019-12-05 MED ORDER — CLONIDINE HCL 0.1 MG PO TABS
0.1000 mg | ORAL_TABLET | ORAL | Status: DC
  Filled 2019-12-05: qty 6

## 2019-12-05 MED ORDER — METHOCARBAMOL 500 MG PO TABS: 500.0000 mg | ORAL_TABLET | Freq: Three times a day (TID) | ORAL | Status: DC | PRN

## 2019-12-05 MED ORDER — NICOTINE 14 MG/24HR TD PT24
14.0000 mg | MEDICATED_PATCH | Freq: Once | TRANSDERMAL | Status: DC
  Administered 2019-12-05: 14 mg via TRANSDERMAL
  Filled 2019-12-05: qty 1

## 2019-12-05 MED ORDER — CLONIDINE HCL 0.1 MG PO TABS: 0.1000 mg | ORAL_TABLET | Freq: Every day | ORAL | Status: DC

## 2019-12-05 MED ORDER — DICYCLOMINE HCL 20 MG PO TABS
20.0000 mg | ORAL_TABLET | Freq: Four times a day (QID) | ORAL | Status: DC | PRN
  Administered 2019-12-05 (×2): 20 mg via ORAL
  Filled 2019-12-05 (×2): qty 1

## 2019-12-05 MED ORDER — ONDANSETRON HCL 4 MG PO TABS: 4.0000 mg | ORAL_TABLET | Freq: Three times a day (TID) | ORAL | Status: DC | PRN

## 2019-12-05 NOTE — ED Notes (Addendum)
Patient given a sandwich, some cookies, soda and a bag of chips.

## 2019-12-05 NOTE — ED Notes (Signed)
Pt sleeping at present, no distress noted, monitoring for safety. 

## 2019-12-05 NOTE — ED Notes (Signed)
Pt asleep with even and unlabored respirations. No distress or discomfort noted. Pt remains safe on the unit. Will continue to monitor. 

## 2019-12-05 NOTE — ED Notes (Signed)
Pt resting at present, urine pregnancy test positive.  Pt notified.

## 2019-12-05 NOTE — ED Notes (Addendum)
Pt awoke this morning crying and anxious about finding out, during her stay at [facility], that she is pregnant. Pt saying she can't have a baby, "I just came for detox and then found out I'm pregnant." I've been in a 10 year relationship and we have an apartment together, and I made a mistake. He will put me out because it's not his." Education, support, and encouragement provided. Pt took a shower and now sittingt in the courtyard outside with MHTs. Pt verbally contracts for safety and  ambulating on the unit with no issues. Pt remains safe on the unit.

## 2019-12-05 NOTE — ED Provider Notes (Addendum)
Behavioral Health Progress Note  Date and Time: 12/05/2019 11:13 AM Name: Tracey Lam MRN:  [id number]  Subjective:  Patient is a 24 year-old female that presented to the [facility] requesting detox from opiates and reported passive suicidal ideations.  While patient was here she discovered that she was pregnant.  Today she reports that she has been dating the same guy for approximately 10 years and she knows that this baby is not his.  She states that she has made the decision that she does not want to keep the baby and plans to have an abortion.  She states that he is currently in detox treatment in [place] and they were trying to get clean at the same time.  She states that she is going to live with her mother and get an abortion before he comes back.  After having a discussion with her the patient continues to be very depressed to the situation and have agreed to continue her on clonidine for withdrawal symptoms.  She states that the clonidine helped her tremendously last night.  She provided me consent to contact her mother.  Patient's mother, Ms. Pam at [phone], was contacted and she reports that she is concerned about the patient's safety with her using substances.  She states she understands that the patient is going to have an abortion because she had already told her about this prior to our conversation.  We discussed the safety plan and also consult with Dr. Evelene Croon.  Safety plan is established for tomorrow and to give the patient 1 more night for detox to ensure a safe discharge tomorrow.  If need be patient will be sent home with short prescription of clonidine to continue detox.  Patient plans to follow-up with an abortion clinic and will also be made appointments with the [facility] and [facility].  Patient will be reassessed tomorrow morning for discharge.  Patient's mother states that she feels that this is a safe plan and that she will be the one picking her up  tomorrow.  Diagnosis:  Final diagnoses:  Current severe episode of major depressive disorder without psychotic features without prior episode (HCC)  Drug dependence, abuse (HCC)  Withdrawal from other psychoactive substance (HCC)    Total Time spent with patient: 30 minutes  Past Psychiatric History: Substance abuse Past Medical History:  Past Medical History:  Diagnosis Date  . Asthma    History reviewed. No pertinent surgical history. Family History: History reviewed. No pertinent family history. Family Psychiatric  History: None reported Social History:  Social History   Substance and Sexual Activity  Alcohol Use Yes   Comment: Occassional      Social History   Substance and Sexual Activity  Drug Use Yes  . Types: Cocaine, Marijuana   Comment: pt reports she used last night     Social History   Socioeconomic History  . Marital status: Single    Spouse name: Not on file  . Number of children: Not on file  . Years of education: Not on file  . Highest education level: Not on file  Occupational History  . Not on file  Tobacco Use  . Smoking status: Former Games developer  . Smokeless tobacco: Never Used  Substance and Sexual Activity  . Alcohol use: Yes    Comment: Occassional   . Drug use: Yes    Types: Cocaine, Marijuana    Comment: pt reports she used last night   . Sexual activity: Yes  Other Topics Concern  . Not on file  Social History Narrative  . Not on file   Social Determinants of Health   Financial Resource Strain:   . Difficulty of Paying Living Expenses: Not on file  Food Insecurity:   . Worried About Programme researcher, broadcasting/film/videounning Out of Food in the Last Year: Not on file  . Ran Out of Food in the Last Year: Not on file  Transportation Needs:   . Lack of Transportation (Medical): Not on file  . Lack of Transportation (Non-Medical): Not on file  Physical Activity:   . Days of Exercise per Week: Not on file  . Minutes of Exercise per Session: Not on file  Stress:    . Feeling of Stress : Not on file  Social Connections:   . Frequency of Communication with Friends and Family: Not on file  . Frequency of Social Gatherings with Friends and Family: Not on file  . Attends Religious Services: Not on file  . Active Member of Clubs or Organizations: Not on file  . Attends BankerClub or Organization Meetings: Not on file  . Marital Status: Not on file   SDOH:  SDOH Screenings   Alcohol Screen:   . Last Alcohol Screening Score (AUDIT): Not on file  Depression (PHQ2-9):   . PHQ-2 Score: Not on file  Financial Resource Strain:   . Difficulty of Paying Living Expenses: Not on file  Food Insecurity:   . Worried About Programme researcher, broadcasting/film/videounning Out of Food in the Last Year: Not on file  . Ran Out of Food in the Last Year: Not on file  Housing:   . Last Housing Risk Score: Not on file  Physical Activity:   . Days of Exercise per Week: Not on file  . Minutes of Exercise per Session: Not on file  Social Connections:   . Frequency of Communication with Friends and Family: Not on file  . Frequency of Social Gatherings with Friends and Family: Not on file  . Attends Religious Services: Not on file  . Active Member of Clubs or Organizations: Not on file  . Attends BankerClub or Organization Meetings: Not on file  . Marital Status: Not on file  Stress:   . Feeling of Stress : Not on file  Tobacco Use: Medium Risk  . Smoking Tobacco Use: Former Smoker  . Smokeless Tobacco Use: Never Used  Transportation Needs:   . Freight forwarderLack of Transportation (Medical): Not on file  . Lack of Transportation (Non-Medical): Not on file   Additional Social History:    Pain Medications: See MAR Prescriptions: See MAR Over the Counter: See MAR History of alcohol / drug use?: Yes Withdrawal Symptoms: Nausea / Vomiting, Irritability, Fever / Chills, Other (Comment) (Hot/cold, headaches.) Name of Substance 1: Opioids. 1 - Age of First Use: Pt reported, she started using three years ago and two year ago she got  bad batches that were but with Fentanyl. 1 - Amount (size/oz): Pt reported, 160mg  daily. 1 - Frequency: Daily. 1 - Duration: UTA 1 - Last Use / Amount: Yesterdat at 11pm.                  Sleep: Good  Appetite:  Fair  Current Medications:  Current Facility-Administered Medications  Medication Dose Route Frequency Provider Last Rate Last Admin  . acetaminophen (TYLENOL) tablet 650 mg  650 mg Oral Q6H PRN Gillermo Murdochhompson, Jacqueline, NP      . alum & mag hydroxide-simeth (MAALOX/MYLANTA) 200-200-20 MG/5ML suspension 30 mL  30  mL Oral Q4H PRN Gillermo Murdoch, NP      . cloNIDine (CATAPRES) tablet 0.1 mg  0.1 mg Oral QID Gillermo Murdoch, NP       Followed by  . [START ON 12/07/2019] cloNIDine (CATAPRES) tablet 0.1 mg  0.1 mg Oral Elpidio Eric, NP       Followed by  . [START ON 12/09/2019] cloNIDine (CATAPRES) tablet 0.1 mg  0.1 mg Oral QAC breakfast Gillermo Murdoch, NP      . dicyclomine (BENTYL) tablet 20 mg  20 mg Oral Q6H PRN Gillermo Murdoch, NP      . diphenhydrAMINE (BENADRYL) capsule 25 mg  25 mg Oral QHS PRN Gillermo Murdoch, NP      . hydrOXYzine (ATARAX/VISTARIL) tablet 25 mg  25 mg Oral Q6H PRN Gillermo Murdoch, NP   25 mg at 12/05/19 0109  . loperamide (IMODIUM) capsule 2-4 mg  2-4 mg Oral PRN Gillermo Murdoch, NP      . magnesium hydroxide (MILK OF MAGNESIA) suspension 30 mL  30 mL Oral Daily PRN Gillermo Murdoch, NP      . methocarbamol (ROBAXIN) tablet 500 mg  500 mg Oral Q8H PRN Gillermo Murdoch, NP      . naproxen (NAPROSYN) tablet 500 mg  500 mg Oral BID PRN Gillermo Murdoch, NP      . ondansetron (ZOFRAN-ODT) disintegrating tablet 4 mg  4 mg Oral Q6H PRN Gillermo Murdoch, NP       Current Outpatient Medications  Medication Sig Dispense Refill  . diphenhydrAMINE (SLEEP AID, DIPHENHYDRAMINE,) 25 MG tablet Take 75 mg by mouth at bedtime as needed for sleep.    . diphenhydramine-acetaminophen (TYLENOL PM) 25-500  MG TABS tablet Take 2 tablets by mouth at bedtime as needed (pain).    . fluconazole (DIFLUCAN) 150 MG tablet Take 1 tablet (150 mg total) by mouth daily. (Patient not taking: Reported on 10/06/2018) 2 tablet 0  . hydrOXYzine (ATARAX/VISTARIL) 25 MG tablet Take 1 tablet (25 mg total) by mouth every 6 (six) hours. 12 tablet 0  . metroNIDAZOLE (FLAGYL) 500 MG tablet Take 1 tablet (500 mg total) by mouth 2 (two) times daily. (Patient not taking: Reported on 10/06/2018) 14 tablet 0  . naproxen (NAPROSYN) 375 MG tablet Take 1 tablet (375 mg total) by mouth 2 (two) times daily. (Patient not taking: Reported on 10/06/2018) 20 tablet 0    Labs  Lab Results:  Admission on 12/04/2019  Component Date Value Ref Range Status  . SARS Coronavirus 2 12/05/2019 NEGATIVE  NEGATIVE Final   Comment: (NOTE) SARS-CoV-2 target nucleic acids are NOT DETECTED.  The SARS-CoV-2 RNA is generally detectable in upper and lower respiratory specimens during the acute phase of infection. The lowest concentration of SARS-CoV-2 viral copies this assay can detect is 250 copies / mL. A negative result does not preclude SARS-CoV-2 infection and should not be used as the sole basis for treatment or other patient management decisions.  A negative result may occur with improper specimen collection / handling, submission of specimen other than nasopharyngeal swab, presence of viral mutation(s) within the areas targeted by this assay, and inadequate number of viral copies (<250 copies / mL). A negative result must be combined with clinical observations, patient history, and epidemiological information.  Fact Sheet for Patients:   BoilerBrush.com.cy  Fact Sheet for Healthcare Providers: https://pope.com/  This test is not yet approved or  cleared by the [place] and has been authorized for detection and/or diagnosis of SARS-CoV-2 by FDA under an  Emergency Use Authorization (EUA).  This EUA will remain in effect (meaning this test can be used) for the duration of the COVID-19 declaration under Section 564(b)(1) of the Act, 21 U.S.C. section 360bbb-3(b)(1), unless the authorization is terminated or revoked sooner.  Performed at [facility], [address], [place], [place] [address]   . SARS Coronavirus 2 Ag 12/05/2019 Negative  Negative Final  . WBC 12/05/2019 12.5* 4.0 - 10.5 K/uL Final  . RBC 12/05/2019 4.65  3.87 - 5.11 MIL/uL Final  . Hemoglobin 12/05/2019 13.6  12.0 - 15.0 g/dL Final  . HCT 38/75/6433 41.0  36 - 46 % Final  . MCV 12/05/2019 88.2  80.0 - 100.0 fL Final  . MCH 12/05/2019 29.2  26.0 - 34.0 pg Final  . MCHC 12/05/2019 33.2  30.0 - 36.0 g/dL Final  . RDW 29/51/8841 13.9  11.5 - 15.5 % Final  . Platelets 12/05/2019 249  150 - 400 K/uL Final  . nRBC 12/05/2019 0.0  0.0 - 0.2 % Final  . Neutrophils Relative % 12/05/2019 73  % Final  . Neutro Abs 12/05/2019 8.9* 1.7 - 7.7 K/uL Final  . Lymphocytes Relative 12/05/2019 19  % Final  . Lymphs Abs 12/05/2019 2.4  0.7 - 4.0 K/uL Final  . Monocytes Relative 12/05/2019 8  % Final  . Monocytes Absolute 12/05/2019 1.0  0 - 1 K/uL Final  . Eosinophils Relative 12/05/2019 0  % Final  . Eosinophils Absolute 12/05/2019 0.0  0 - 0 K/uL Final  . Basophils Relative 12/05/2019 0  % Final  . Basophils Absolute 12/05/2019 0.0  0 - 0 K/uL Final  . Immature Granulocytes 12/05/2019 0  % Final  . Abs Immature Granulocytes 12/05/2019 0.05  0.00 - 0.07 K/uL Final   Performed at [facility], [address] [place], [place] [address]  . Sodium 12/05/2019 135  135 - 145 mmol/L Final  . Potassium 12/05/2019 3.8  3.5 - 5.1 mmol/L Final  . Chloride 12/05/2019 101  98 - 111 mmol/L Final  . CO2 12/05/2019 22  22 - 32 mmol/L Final  . Glucose, Bld 12/05/2019 93  70 - 99 mg/dL Final   Glucose reference range applies only to samples taken after fasting for at least 8 hours.  .  BUN 12/05/2019 <5* 6 - 20 mg/dL Final  . Creatinine, Ser 12/05/2019 0.51  0.44 - 1.00 mg/dL Final  . Calcium 01/60/1093 9.5  8.9 - 10.3 mg/dL Final  . Total Protein 12/05/2019 7.9  6.5 - 8.1 g/dL Final  . Albumin 23/55/7322 4.2  3.5 - 5.0 g/dL Final  . AST 02/54/2706 16  15 - 41 U/L Final  . ALT 12/05/2019 11  0 - 44 U/L Final  . Alkaline Phosphatase 12/05/2019 80  38 - 126 U/L Final  . Total Bilirubin 12/05/2019 0.5  0.3 - 1.2 mg/dL Final  . GFR calc non Af Amer 12/05/2019 >60  >60 mL/min Final  . GFR calc Af Amer 12/05/2019 >60  >60 mL/min Final  . Anion gap 12/05/2019 12  5 - 15 Final   Performed at [facility], [address] [place], [place] [address]  . Alcohol, Ethyl (B) 12/05/2019 <10  <10 mg/dL Final   Comment: (NOTE) Lowest detectable limit for serum alcohol is 10 mg/dL.  For medical purposes only. Performed at [facility], [address], [place], [place] [address]   . Magnesium 12/05/2019 2.3  1.7 - 2.4 mg/dL Final   Performed at [facility], [address], [place], [place] [address]  . TSH 12/05/2019 0.344* 0.350 - 4.500 uIU/mL Final   Comment: Performed by a 3rd Generation assay with a functional sensitivity of <=0.01 uIU/mL. Performed at [facility], [address] [place], [place] [address]   . Glucose-Capillary 12/05/2019 108* 70 - 99 mg/dL Final   Glucose reference range applies only to samples taken after fasting for at least 8 hours.  Marland Kitchen POC Amphetamine UR 12/05/2019 None Detected  None Detected Final  . POC Secobarbital (BAR) 12/05/2019 None Detected  None Detected Final  . POC Buprenorphine (BUP) 12/05/2019 None Detected  None Detected Final  . POC Oxazepam (BZO) 12/05/2019 None Detected  None Detected Final  . POC Cocaine UR 12/05/2019 None Detected  None Detected Final  . POC Methamphetamine UR 12/05/2019 None Detected  None Detected Final  . POC Morphine 12/05/2019 Positive* None Detected Final  . POC Oxycodone UR 12/05/2019  None Detected  None Detected Final  . POC Methadone UR 12/05/2019 None Detected  None Detected Final  . POC Marijuana UR 12/05/2019 Positive* None Detected Final  . Preg Test, Ur 12/05/2019 POSITIVE* NEGATIVE Final   Comment:        THE SENSITIVITY OF THIS METHODOLOGY IS >24 mIU/mL   . Cholesterol 12/05/2019 169  0 - 200 mg/dL Final  . Triglycerides 12/05/2019 61  <150 mg/dL Final  . HDL 91/47/8295 57  >40 mg/dL Final  . Total CHOL/HDL Ratio 12/05/2019 3.0  RATIO Final  . VLDL 12/05/2019 12  0 - 40 mg/dL Final  . LDL Cholesterol 12/05/2019 100* 0 - 99 mg/dL Final   Comment:        Total Cholesterol/HDL:CHD Risk Coronary Heart Disease Risk Table                     Men   Women  1/2 Average Risk   3.4   3.3  Average Risk       5.0   4.4  2 X Average Risk   9.6   7.1  3 X Average Risk  23.4   11.0        Use the calculated Patient Ratio above and the CHD Risk Table to determine the patient's CHD Risk.        ATP III CLASSIFICATION (LDL):  <100     mg/dL   Optimal  621-308  mg/dL   Near or Above                    Optimal  130-159  mg/dL   Borderline  657-846  mg/dL   High  >962     mg/dL   Very High Performed at [facility], [address] [place], [place] [address]   . Preg, Serum 12/05/2019 POSITIVE* NEGATIVE Final   Comment: REPEATED TO VERIFY        THE SENSITIVITY OF THIS METHODOLOGY IS >10 mIU/mL. Performed at [facility], [address] [place], [place] [address]     Blood Alcohol level:  Lab Results  Component Value Date   ETH <10 12/05/2019    Metabolic Disorder Labs: No results found for: HGBA1C, MPG No results found for: PROLACTIN Lab Results  Component Value Date   CHOL 169 12/05/2019   TRIG 61  12/05/2019   HDL 57 12/05/2019   CHOLHDL 3.0 12/05/2019   VLDL 12 12/05/2019   LDLCALC 100 (H) 12/05/2019    Therapeutic Lab Levels: No results found for: LITHIUM No results found for: VALPROATE No components found for:   CBMZ  Physical Findings     Musculoskeletal  Strength & Muscle Tone: within normal limits Gait & Station: normal Patient leans: N/A  Psychiatric Specialty Exam  Presentation  General Appearance: Casual  Eye Contact:Good  Speech:Clear and Coherent;Normal Rate  Speech Volume:Normal  Handedness:Right   Mood and Affect  Mood:Dysphoric;Anxious  Affect:Congruent;Tearful   Thought Process  Thought Processes:Coherent  Descriptions of Associations:Intact  Orientation:Full (Time, Place and Person)  Thought Content:WDL  Hallucinations:Hallucinations: None  Ideas of Reference:None  Suicidal Thoughts:Suicidal Thoughts: No SI Passive Intent and/or Plan: Without Intent;Without Plan  Homicidal Thoughts:Homicidal Thoughts: No   Sensorium  Memory:Immediate Good;Recent Good;Remote Good  Judgment:Fair  Insight:Fair   Executive Functions  Concentration:Fair  Attention Span:Fair  Recall:Fair  Fund of Knowledge:Fair  Language:Good   Psychomotor Activity  Psychomotor Activity:Psychomotor Activity: Normal   Assets  Assets:Communication Skills;Desire for Improvement;Housing;Transportation   Sleep  Sleep:Sleep: Fair Number of Hours of Sleep: -1   Physical Exam  Physical Exam Vitals and nursing note reviewed.  Constitutional:      Appearance: She is well-developed.  Cardiovascular:     Rate and Rhythm: Normal rate.  Pulmonary:     Effort: Pulmonary effort is normal.  Musculoskeletal:        General: Normal range of motion.  Skin:    General: Skin is warm.  Neurological:     Mental Status: She is alert and oriented to person, place, and time.  Psychiatric:        Mood and Affect: Mood is anxious and depressed. Affect is tearful.    Review of Systems  Constitutional: Negative.   HENT: Negative.   Eyes: Negative.   Respiratory: Negative.   Cardiovascular: Negative.   Gastrointestinal: Negative.   Genitourinary: Negative.   Musculoskeletal:  Negative.   Skin: Negative.   Neurological: Negative.   Endo/Heme/Allergies: Negative.   Psychiatric/Behavioral: Positive for depression. The patient is nervous/anxious.    Blood pressure 121/82, pulse (!) 118, temperature 97.7 F (36.5 C), temperature source Temporal, resp. rate 18, SpO2 99 %. There is no height or weight on file to calculate BMI.  Treatment Plan Summary: Daily contact with patient to assess and evaluate symptoms and progress in treatment and Continue Clonidine detox protocol  Maryfrances Bunnell, FNP 12/05/2019 11:13 AM

## 2019-12-05 NOTE — ED Notes (Signed)
Clonidine held d/t bp out of parameterrs.  121/82

## 2019-12-05 NOTE — ED Notes (Signed)
Pt A&O x 4, no distress noted, resting at present, monitoring for safety.

## 2019-12-05 NOTE — ED Triage Notes (Signed)
Presents with SI, no plan noted. 

## 2019-12-05 NOTE — ED Notes (Signed)
Pt presents with SI, no current plan, previous hx of SI,  Denies HI, AVH.  Pt reports her father passed away x 3 yrs ago and she started taking pills.  Skin search completed.  Pt calm & cooperative,  Monitoring for safety.

## 2019-12-05 NOTE — ED Provider Notes (Signed)
Behavioral Health Progress Note  Date and Time: 12/05/2019 5:33 AM Name: Tracey Lam MRN:  [id number]  Subjective:  The patient is resting quietly. The patient pregnancy test is positive. The patient was placed on [place] detox protocol prior to getting her the positive pregnancy result. Consulted with Dr. Manson Passey at [facility] and he voiced to only treat her symptoms. Verbal order to the patient nurse to only give her Tylenol, Zofran or Benadryl if the patient is requesting something to ease her discomfort.  Diagnosis:  Final diagnoses:  Current severe episode of major depressive disorder without psychotic features without prior episode (HCC)  Drug dependence, abuse (HCC)  Withdrawal from other psychoactive substance (HCC)    Total Time spent with patient: 20 minutes  Past Psychiatric History: Polysubstance abuse, Suicidal Ideation Past Medical History:  Past Medical History:  Diagnosis Date  . Asthma    History reviewed. No pertinent surgical history. Family History: History reviewed. No pertinent family history. Family Psychiatric  History: Unknown Social History:  Social History   Substance and Sexual Activity  Alcohol Use Yes   Comment: Occassional      Social History   Substance and Sexual Activity  Drug Use Yes  . Types: Cocaine, Marijuana   Comment: pt reports she used last night     Social History   Socioeconomic History  . Marital status: Single    Spouse name: Not on file  . Number of children: Not on file  . Years of education: Not on file  . Highest education level: Not on file  Occupational History  . Not on file  Tobacco Use  . Smoking status: Former Games developer  . Smokeless tobacco: Never Used  Substance and Sexual Activity  . Alcohol use: Yes    Comment: Occassional   . Drug use: Yes    Types: Cocaine, Marijuana    Comment: pt reports she used last night   . Sexual activity: Yes  Other Topics Concern  . Not on file  Social History Narrative  . Not on  file   Social Determinants of Health   Financial Resource Strain:   . Difficulty of Paying Living Expenses: Not on file  Food Insecurity:   . Worried About Programme researcher, broadcasting/film/video in the Last Year: Not on file  . Ran Out of Food in the Last Year: Not on file  Transportation Needs:   . Lack of Transportation (Medical): Not on file  . Lack of Transportation (Non-Medical): Not on file  Physical Activity:   . Days of Exercise per Week: Not on file  . Minutes of Exercise per Session: Not on file  Stress:   . Feeling of Stress : Not on file  Social Connections:   . Frequency of Communication with Friends and Family: Not on file  . Frequency of Social Gatherings with Friends and Family: Not on file  . Attends Religious Services: Not on file  . Active Member of Clubs or Organizations: Not on file  . Attends Banker Meetings: Not on file  . Marital Status: Not on file   SDOH:  SDOH Screenings   Alcohol Screen:   . Last Alcohol Screening Score (AUDIT): Not on file  Depression (PHQ2-9):   . PHQ-2 Score: Not on file  Financial Resource Strain:   . Difficulty of Paying Living Expenses: Not on file  Food Insecurity:   . Worried About Programme researcher, broadcasting/film/video in the Last Year: Not on file  . Ran Out of  Food in the Last Year: Not on file  Housing:   . Last Housing Risk Score: Not on file  Physical Activity:   . Days of Exercise per Week: Not on file  . Minutes of Exercise per Session: Not on file  Social Connections:   . Frequency of Communication with Friends and Family: Not on file  . Frequency of Social Gatherings with Friends and Family: Not on file  . Attends Religious Services: Not on file  . Active Member of Clubs or Organizations: Not on file  . Attends Club or Organization Meetings: Not on file  . Marital Status: Not on file  Stress:   . Feeling of Stress : Not on file  Tobacco Use: Medium Risk  . Smoking Tobacco Use: Former Smoker  . SmokelesBankers Tobacco Use: Never  Used  Transportation Needs:   . Freight forwarderLack of Transportation (Medical): Not on file  . Lack of Transportation (Non-Medical): Not on file   Additional Social History:    Pain Medications: See MAR Prescriptions: See MAR Over the Counter: See MAR History of alcohol / drug use?: Yes Withdrawal Symptoms: Nausea / Vomiting, Irritability, Fever / Chills, Other (Comment) (Hot/cold, headaches.) Name of Substance 1: Opioids. 1 - Age of First Use: Pt reported, she started using three years ago and two year ago she got bad batches that were but with Fentanyl. 1 - Amount (size/oz): Pt reported, 160mg  daily. 1 - Frequency: Daily. 1 - Duration: UTA 1 - Last Use / Amount: Yesterdat at 11pm.                  Sleep: Poor  Appetite:  Poor  Current Medications:  Current Facility-Administered Medications  Medication Dose Route Frequency Provider Last Rate Last Admin  . acetaminophen (TYLENOL) tablet 650 mg  650 mg Oral Q6H PRN Gillermo Murdochhompson, Shunda Rabadi, NP      . alum & mag hydroxide-simeth (MAALOX/MYLANTA) 200-200-20 MG/5ML suspension 30 mL  30 mL Oral Q4H PRN Gillermo Murdochhompson, Calvary Difranco, NP      . cloNIDine (CATAPRES) tablet 0.1 mg  0.1 mg Oral QID Gillermo Murdochhompson, Careen Mauch, NP       Followed by  . [START ON 12/07/2019] cloNIDine (CATAPRES) tablet 0.1 mg  0.1 mg Oral Elpidio EricBH-qamhs Charmin Aguiniga, NP       Followed by  . [START ON 12/09/2019] cloNIDine (CATAPRES) tablet 0.1 mg  0.1 mg Oral QAC breakfast Gillermo Murdochhompson, Melora Menon, NP      . dicyclomine (BENTYL) tablet 20 mg  20 mg Oral Q6H PRN Gillermo Murdochhompson, Paden Kuras, NP      . hydrOXYzine (ATARAX/VISTARIL) tablet 25 mg  25 mg Oral Q6H PRN Gillermo Murdochhompson, Durand Wittmeyer, NP   25 mg at 12/05/19 0109  . loperamide (IMODIUM) capsule 2-4 mg  2-4 mg Oral PRN Gillermo Murdochhompson, Laurianne Floresca, NP      . magnesium hydroxide (MILK OF MAGNESIA) suspension 30 mL  30 mL Oral Daily PRN Gillermo Murdochhompson, Neiva Maenza, NP      . methocarbamol (ROBAXIN) tablet 500 mg  500 mg Oral Q8H PRN Gillermo Murdochhompson, Bohden Dung, NP      .  naproxen (NAPROSYN) tablet 500 mg  500 mg Oral BID PRN Gillermo Murdochhompson, Jolee Critcher, NP      . ondansetron (ZOFRAN-ODT) disintegrating tablet 4 mg  4 mg Oral Q6H PRN Gillermo Murdochhompson, Adisson Deak, NP       Current Outpatient Medications  Medication Sig Dispense Refill  . diphenhydrAMINE (SLEEP AID, DIPHENHYDRAMINE,) 25 MG tablet Take 75 mg by mouth at bedtime as needed for sleep.    . diphenhydramine-acetaminophen (TYLENOL  PM) 25-500 MG TABS tablet Take 2 tablets by mouth at bedtime as needed (pain).    . fluconazole (DIFLUCAN) 150 MG tablet Take 1 tablet (150 mg total) by mouth daily. (Patient not taking: Reported on 10/06/2018) 2 tablet 0  . hydrOXYzine (ATARAX/VISTARIL) 25 MG tablet Take 1 tablet (25 mg total) by mouth every 6 (six) hours. 12 tablet 0  . metroNIDAZOLE (FLAGYL) 500 MG tablet Take 1 tablet (500 mg total) by mouth 2 (two) times daily. (Patient not taking: Reported on 10/06/2018) 14 tablet 0  . naproxen (NAPROSYN) 375 MG tablet Take 1 tablet (375 mg total) by mouth 2 (two) times daily. (Patient not taking: Reported on 10/06/2018) 20 tablet 0    Labs  Lab Results:  Admission on 12/04/2019  Component Date Value Ref Range Status  . SARS Coronavirus 2 12/05/2019 NEGATIVE  NEGATIVE Final   Comment: (NOTE) SARS-CoV-2 target nucleic acids are NOT DETECTED.  The SARS-CoV-2 RNA is generally detectable in upper and lower respiratory specimens during the acute phase of infection. The lowest concentration of SARS-CoV-2 viral copies this assay can detect is 250 copies / mL. A negative result does not preclude SARS-CoV-2 infection and should not be used as the sole basis for treatment or other patient management decisions.  A negative result may occur with improper specimen collection / handling, submission of specimen other than nasopharyngeal swab, presence of viral mutation(s) within the areas targeted by this assay, and inadequate number of viral copies (<250 copies / mL). A negative result must be  combined with clinical observations, patient history, and epidemiological information.  Fact Sheet for Patients:   BoilerBrush.com.cy  Fact Sheet for Healthcare Providers: https://pope.com/  This test is not yet approved or                           cleared by the [place] FDA and has been authorized for detection and/or diagnosis of SARS-CoV-2 by FDA under an Emergency Use Authorization (EUA).  This EUA will remain in effect (meaning this test can be used) for the duration of the COVID-19 declaration under Section 564(b)(1) of the Act, 21 U.S.C. section 360bbb-3(b)(1), unless the authorization is terminated or revoked sooner.  Performed at [facility], [address] [place], [place] [address]   . SARS Coronavirus 2 Ag 12/05/2019 Negative  Negative Final  . WBC 12/05/2019 12.5* 4.0 - 10.5 K/uL Final  . RBC 12/05/2019 4.65  3.87 - 5.11 MIL/uL Final  . Hemoglobin 12/05/2019 13.6  12.0 - 15.0 g/dL Final  . HCT 60/45/4098 41.0  36 - 46 % Final  . MCV 12/05/2019 88.2  80.0 - 100.0 fL Final  . MCH 12/05/2019 29.2  26.0 - 34.0 pg Final  . MCHC 12/05/2019 33.2  30.0 - 36.0 g/dL Final  . RDW 11/91/4782 13.9  11.5 - 15.5 % Final  . Platelets 12/05/2019 249  150 - 400 K/uL Final  . nRBC 12/05/2019 0.0  0.0 - 0.2 % Final  . Neutrophils Relative % 12/05/2019 73  % Final  . Neutro Abs 12/05/2019 8.9* 1.7 - 7.7 K/uL Final  . Lymphocytes Relative 12/05/2019 19  % Final  . Lymphs Abs 12/05/2019 2.4  0.7 - 4.0 K/uL Final  . Monocytes Relative 12/05/2019 8  % Final  . Monocytes Absolute 12/05/2019 1.0  0 - 1 K/uL Final  . Eosinophils Relative 12/05/2019 0  % Final  . Eosinophils Absolute 12/05/2019 0.0  0 -  0 K/uL Final  . Basophils Relative 12/05/2019 0  % Final  . Basophils Absolute 12/05/2019 0.0  0 - 0 K/uL Final  . Immature Granulocytes 12/05/2019 0  % Final  . Abs Immature Granulocytes 12/05/2019 0.05  0.00 - 0.07 K/uL Final    Performed at [facility], [address], [place], [place] [address]  . Alcohol, Ethyl (B) 12/05/2019 <10  <10 mg/dL Final   Comment: (NOTE) Lowest detectable limit for serum alcohol is 10 mg/dL.  For medical purposes only. Performed at [facility], [address] [place], [place] [address]   . TSH 12/05/2019 0.344* 0.350 - 4.500 uIU/mL Final   Comment: Performed by a 3rd Generation assay with a functional sensitivity of <=0.01 uIU/mL. Performed at [facility], [address], [place], [place] [address]   . Glucose-Capillary 12/05/2019 108* 70 - 99 mg/dL Final   Glucose reference range applies only to samples taken after fasting for at least 8 hours.  Marland Kitchen POC Amphetamine UR 12/05/2019 None Detected  None Detected Final  . POC Secobarbital (BAR) 12/05/2019 None Detected  None Detected Final  . POC Buprenorphine (BUP) 12/05/2019 None Detected  None Detected Final  . POC Oxazepam (BZO) 12/05/2019 None Detected  None Detected Final  . POC Cocaine UR 12/05/2019 None Detected  None Detected Final  . POC Methamphetamine UR 12/05/2019 None Detected  None Detected Final  . POC Morphine 12/05/2019 Positive* None Detected Final  . POC Oxycodone UR 12/05/2019 None Detected  None Detected Final  . POC Methadone UR 12/05/2019 None Detected  None Detected Final  . POC Marijuana UR 12/05/2019 Positive* None Detected Final  . Preg Test, Ur 12/05/2019 POSITIVE* NEGATIVE Final   Comment:        THE SENSITIVITY OF THIS METHODOLOGY IS >24 mIU/mL     Blood Alcohol level:  Lab Results  Component Value Date   ETH <10 12/05/2019    Metabolic Disorder Labs: No results found for: HGBA1C, MPG No results found for: PROLACTIN No results found for: CHOL, TRIG, HDL, CHOLHDL, VLDL, LDLCALC  Therapeutic Lab Levels: No results found for: LITHIUM No results found for: VALPROATE No components found for:  CBMZ  Physical Findings     Musculoskeletal  Strength & Muscle Tone: within  normal limits Gait & Station: normal Patient leans: N/A  Psychiatric Specialty Exam  Presentation  General Appearance: Appropriate for Environment;Disheveled  Eye Contact:Poor  Speech:Clear and Coherent  Speech Volume:Decreased  Handedness:No data recorded  Mood and Affect  Mood:Depressed;Anxious;Irritable  Affect:Blunt;Congruent;Depressed;Flat;Inappropriate;Tearful   Thought Process  Thought Processes:Coherent  Descriptions of Associations:Intact  Orientation:Full (Time, Place and Person)  Thought Content:Logical  Hallucinations:Hallucinations: None  Ideas of Reference:None  Suicidal Thoughts:Suicidal Thoughts: Yes, Passive SI Passive Intent and/or Plan: Without Intent;Without Plan  Homicidal Thoughts:Homicidal Thoughts: No   Sensorium  Memory:Immediate Good;Recent Good;Remote Good  Judgment:Poor  Insight:Lacking   Executive Functions  Concentration:Poor  Attention Span:Poor  Recall:Fair  Fund of Knowledge:Fair  Language:Good   Psychomotor Activity  Psychomotor Activity:Psychomotor Activity: Normal   Assets  Assets:Desire for Improvement;Physical Health;Resilience   Sleep  Sleep:Sleep: Poor Number of Hours of Sleep: -1   Physical Exam  Physical Exam ROS Blood pressure 124/72, pulse 95, temperature 98.1 F (36.7 C), temperature source Oral, resp. rate 20, SpO2 99 %. There is no height or weight on file to calculate BMI.  Treatment Plan Summary: Plan Positive pregnancy test. Treat the patient with only Tylenol 650 mg q 6hrs, Zofran 4mg   q 8 hrs or Benadryl 25 mg at hs for sleep.  Gillermo Murdoch, NP 12/05/2019 5:33 AM

## 2019-12-05 NOTE — ED Notes (Signed)
Patient is outside  

## 2019-12-05 NOTE — ED Provider Notes (Signed)
Behavioral Health Admission [facility])  Date: 12/05/19 Patient Name: Tracey Lam MRN: [id number] Chief Complaint:  Chief Complaint  Patient presents with  . Suicidal   Chief Complaint/Presenting Problem: Pt wants to detox from opioids.  Diagnoses:  Final diagnoses:  Current severe episode of major depressive disorder without psychotic features without prior episode (HCC)  Drug dependence, abuse (HCC)  Withdrawal from other psychoactive substance (HCC)   HPI: Tracey Lam is a 24 year old female who presents voluntarily via POV at  [facility]. The patient is here because she wants to detox off Fentanyl. The patient is exhibiting symptoms of abdominal pain, chills, warmth, and restlessness. The patient disclosed that her father passed away three years ago, and she started taking pills. The patient-related, two years ago she got bad batches cut with Fentanyl. She is voicing suicidal ideation for the last few days with no current plan but has access to means. The patient does not appear to be responding to internal or external stimuli. Neither is the patient presenting with any delusional thinking. The patient denies auditory or visual hallucinations. The patient admits to suicidal ideation but denies homicidal or self-harm ideations. The patient is not presenting with any psychotic or paranoid behaviors. During an encounter with the patient, she was able to answer questions appropriately. She disclosed she takes 160 mg daily of opioids and has been clean for 24 hours. The patient is experiencing withdrawals symptoms. She is voicing "I feel sick, I vomited, I am hot and cold at the same time, and my head is hurting. Detox protocol placed for patient to assist her in relieving her symptoms.  PHQ 2-9:     Total Time spent with patient: 20 minutes  Musculoskeletal  Strength & Muscle Tone: within normal limits Gait & Station: normal Patient leans: N/A  Psychiatric Specialty Exam   Presentation General Appearance: No data recorded Eye Contact:No data recorded Speech:No data recorded Speech Volume:No data recorded Handedness:No data recorded  Mood and Affect  Mood:No data recorded Affect:No data recorded  Thought Process  Thought Processes:No data recorded Descriptions of Associations:No data recorded Orientation:No data recorded Thought Content:No data recorded Hallucinations:No data recorded Ideas of Reference:No data recorded Suicidal Thoughts:No data recorded Homicidal Thoughts:No data recorded  Sensorium  Memory:No data recorded Judgment:No data recorded Insight:No data recorded  Executive Functions  Concentration:No data recorded Attention Span:No data recorded Recall:No data recorded Fund of Knowledge:No data recorded Language:No data recorded  Psychomotor Activity  Psychomotor Activity:No data recorded  Assets  Assets:No data recorded  Sleep  Sleep:No data recorded  Physical Exam ROS  Blood pressure 124/72, pulse 95, temperature 98.1 F (36.7 C), temperature source Oral, resp. rate 20, SpO2 99 %. There is no height or weight on file to calculate BMI.  Past Psychiatric History: Polysubstance abuse, Suicidal Ideation   Is the patient at risk to self? Yes  Has the patient been a risk to self in the past 6 months? Yes .    Has the patient been a risk to self within the distant past? Yes   Is the patient a risk to others? Yes   Has the patient been a risk to others in the past 6 months? Yes   Has the patient been a risk to others within the distant past? Yes   Past Medical History:  Past Medical History:  Diagnosis Date  . Asthma    History reviewed. No pertinent surgical history.  Family History: History reviewed. No pertinent family history.  Social History:  Social History   Socioeconomic History  . Marital status: Single    Spouse name: Not on file  . Number of children: Not on file  . Years of education: Not on  file  . Highest education level: Not on file  Occupational History  . Not on file  Tobacco Use  . Smoking status: Former Games developer  . Smokeless tobacco: Never Used  Substance and Sexual Activity  . Alcohol use: Yes    Comment: Occassional   . Drug use: Yes    Types: Cocaine, Marijuana    Comment: pt reports she used last night   . Sexual activity: Yes  Other Topics Concern  . Not on file  Social History Narrative  . Not on file   Social Determinants of Health   Financial Resource Strain:   . Difficulty of Paying Living Expenses: Not on file  Food Insecurity:   . Worried About Programme researcher, broadcasting/film/video in the Last Year: Not on file  . Ran Out of Food in the Last Year: Not on file  Transportation Needs:   . Lack of Transportation (Medical): Not on file  . Lack of Transportation (Non-Medical): Not on file  Physical Activity:   . Days of Exercise per Week: Not on file  . Minutes of Exercise per Session: Not on file  Stress:   . Feeling of Stress : Not on file  Social Connections:   . Frequency of Communication with Friends and Family: Not on file  . Frequency of Social Gatherings with Friends and Family: Not on file  . Attends Religious Services: Not on file  . Active Member of Clubs or Organizations: Not on file  . Attends Banker Meetings: Not on file  . Marital Status: Not on file  Intimate Partner Violence:   . Fear of Current or Ex-Partner: Not on file  . Emotionally Abused: Not on file  . Physically Abused: Not on file  . Sexually Abused: Not on file    SDOH:  SDOH Screenings   Alcohol Screen:   . Last Alcohol Screening Score (AUDIT): Not on file  Depression (PHQ2-9):   . PHQ-2 Score: Not on file  Financial Resource Strain:   . Difficulty of Paying Living Expenses: Not on file  Food Insecurity:   . Worried About Programme researcher, broadcasting/film/video in the Last Year: Not on file  . Ran Out of Food in the Last Year: Not on file  Housing:   . Last Housing Risk Score:  Not on file  Physical Activity:   . Days of Exercise per Week: Not on file  . Minutes of Exercise per Session: Not on file  Social Connections:   . Frequency of Communication with Friends and Family: Not on file  . Frequency of Social Gatherings with Friends and Family: Not on file  . Attends Religious Services: Not on file  . Active Member of Clubs or Organizations: Not on file  . Attends Banker Meetings: Not on file  . Marital Status: Not on file  Stress:   . Feeling of Stress : Not on file  Tobacco Use: Medium Risk  . Smoking Tobacco Use: Former Smoker  . Smokeless Tobacco Use: Never Used  Transportation Needs:   . Freight forwarder (Medical): Not on file  . Lack of Transportation (Non-Medical): Not on file    Last Labs:  Admission on 12/04/2019  Component Date Value Ref Range Status  . SARS Coronavirus 2 Ag 12/05/2019 Negative  Negative Final  . WBC 12/05/2019 12.5* 4.0 - 10.5 K/uL Final  . RBC 12/05/2019 4.65  3.87 - 5.11 MIL/uL Final  . Hemoglobin 12/05/2019 13.6  12.0 - 15.0 g/dL Final  . HCT 75/17/0017 41.0  36 - 46 % Final  . MCV 12/05/2019 88.2  80.0 - 100.0 fL Final  . MCH 12/05/2019 29.2  26.0 - 34.0 pg Final  . MCHC 12/05/2019 33.2  30.0 - 36.0 g/dL Final  . RDW 49/44/9675 13.9  11.5 - 15.5 % Final  . Platelets 12/05/2019 249  150 - 400 K/uL Final  . nRBC 12/05/2019 0.0  0.0 - 0.2 % Final  . Neutrophils Relative % 12/05/2019 73  % Final  . Neutro Abs 12/05/2019 8.9* 1.7 - 7.7 K/uL Final  . Lymphocytes Relative 12/05/2019 19  % Final  . Lymphs Abs 12/05/2019 2.4  0.7 - 4.0 K/uL Final  . Monocytes Relative 12/05/2019 8  % Final  . Monocytes Absolute 12/05/2019 1.0  0 - 1 K/uL Final  . Eosinophils Relative 12/05/2019 0  % Final  . Eosinophils Absolute 12/05/2019 0.0  0 - 0 K/uL Final  . Basophils Relative 12/05/2019 0  % Final  . Basophils Absolute 12/05/2019 0.0  0 - 0 K/uL Final  . Immature Granulocytes 12/05/2019 0  % Final  . Abs Immature  Granulocytes 12/05/2019 0.05  0.00 - 0.07 K/uL Final   Performed at [facility], [address], [place], [place] [address]  . Alcohol, Ethyl (B) 12/05/2019 <10  <10 mg/dL Final   Comment: (NOTE) Lowest detectable limit for serum alcohol is 10 mg/dL.  For medical purposes only. Performed at [facility], [address], [place], [place] [address]   . Glucose-Capillary 12/05/2019 108* 70 - 99 mg/dL Final   Glucose reference range applies only to samples taken after fasting for at least 8 hours.  Marland Kitchen POC Amphetamine UR 12/05/2019 None Detected  None Detected Final  . POC Secobarbital (BAR) 12/05/2019 None Detected  None Detected Final  . POC Buprenorphine (BUP) 12/05/2019 None Detected  None Detected Final  . POC Oxazepam (BZO) 12/05/2019 None Detected  None Detected Final  . POC Cocaine UR 12/05/2019 None Detected  None Detected Final  . POC Methamphetamine UR 12/05/2019 None Detected  None Detected Final  . POC Morphine 12/05/2019 Positive* None Detected Final  . POC Oxycodone UR 12/05/2019 None Detected  None Detected Final  . POC Methadone UR 12/05/2019 None Detected  None Detected Final  . POC Marijuana UR 12/05/2019 Positive* None Detected Final  . Preg Test, Ur 12/05/2019 POSITIVE* NEGATIVE Final   Comment:        THE SENSITIVITY OF THIS METHODOLOGY IS >24 mIU/mL     Allergies: Patient has no known allergies.  PTA Medications: (Not in a hospital admission)   Medical Decision Making      Recommendations  Based on my evaluation the patient does not appear to have an emergency medical condition.  Gillermo Murdoch, NP 12/05/19  4:40 AM

## 2019-12-06 ENCOUNTER — Emergency Department (HOSPITAL_COMMUNITY): Admission: EM | Admit: 2019-12-06 | Discharge: 2019-12-06 | Discharge status: Against Medical Advice | Payer: Self-pay

## 2019-12-06 LAB — PROLACTIN: Prolactin: 9.2 ng/mL (ref 4.8–23.3)

## 2019-12-06 LAB — HCG, QUANTITATIVE, PREGNANCY: hCG, Beta Chain, Quant, S: 16029 m[IU]/mL — ABNORMAL HIGH (ref <5)

## 2019-12-06 MED ORDER — CLONIDINE HCL 0.1 MG PO TABS: 0.1000 mg | ORAL_TABLET | ORAL | 0 refills | Status: DC

## 2019-12-06 MED ORDER — DIPHENHYDRAMINE HCL 25 MG PO CAPS
25.0000 mg | ORAL_CAPSULE | Freq: Once | ORAL | Status: AC
  Administered 2019-12-06: 25 mg via ORAL
  Filled 2019-12-06: qty 1

## 2019-12-06 NOTE — Discharge Instructions (Addendum)
Take medications as prescribed Keep scheduled appointments 

## 2019-12-06 NOTE — ED Notes (Signed)
Pt sleeping at present, no distress noted, monitoring for safety. 

## 2019-12-06 NOTE — Progress Notes (Signed)
Received Tracey Lam this AM awake in her chair bed. She got up showered, ate and c/o abdomen cramping. She is trying to decide if she wants to go the the emergency room.

## 2019-12-06 NOTE — Progress Notes (Signed)
Anely received her discharge order, the AVS was reviewed with her and her questions answered. She received her prescription medications, [facility] was called and she was transported to [facility] at 0940 hrs.

## 2019-12-06 NOTE — ED Notes (Signed)
Pt given shortbread cookies

## 2019-12-06 NOTE — ED Provider Notes (Signed)
FBC/OBS ASAP Discharge Summary  Date and Time: 12/06/2019 8:34 AM  Name: Tracey Lam  MRN:  [id number]   Discharge Diagnoses:  Final diagnoses:  Current severe episode of major depressive disorder without psychotic features without prior episode (HCC)  Drug dependence, abuse (HCC)  Withdrawal from other psychoactive substance (HCC)    Subjective: Patient reports this morning that as for her mental health she is feeling better.  Patient denies any suicidal or homicidal ideations and denies any hallucinations.  Patient reports that she has had some minor withdrawal symptoms such as sweating and would like to continue some of the medications at home.  Patient continues to report that she is not feeling well physically because she has been having some severe abdominal pain last night and today.  Patient stated that she would like to be discharged to her mother and follow-up at the emergency department.  She states that she continues to feel that she needs to have an abortion because that would be the best thing for her and what she needs to do to become stable since she has substance abuse issues.  Stay Summary: Patient is a 24 year old female who presented to the [facility] requesting detox from opiates and reporting passive suicidal ideations.  While she was here to discover that she was pregnant.  Patient was admitted to the continuous observation unit and placed on clonidine detox protocol.  Patient had decided that she was going to have an abortion.  After discussing with Dr. Lafayette Dragon decided to keep patient overnight to ensure safe discharge home with mother and to ensure that there was minimal withdrawal symptoms.  Today patient reported that she is not suicidal nor homicidal and denied any hallucinations.  She did report having some severe abdominal pain.  Patient is psychiatrically cleared and was discharged and provided safe transport to [facility] emergency department for evaluation of her severe  abdominal pain.  Patient had contacted her mother and requested her mother to assist her at the emergency department.  Collateral information was gained from patient's mother yesterday and there was no concern for the patient discharging home yesterday for safety issues just wanted to make sure that the patient was going to have minimal withdrawal symptoms.  Patient was provided with a sample of clonidine for the next 2 days to continue her detox protocol at home.  I did consult with pharmacy and confirmed that there was no concern with the clonidine causing an acute issue with her pregnancy.  Patient was provided with samples of clonidine to take home with her.  Total Time spent with patient: 30 minutes  Past Psychiatric History: Substance abuse Past Medical History:  Past Medical History:  Diagnosis Date  . Asthma    History reviewed. No pertinent surgical history. Family History: History reviewed. No pertinent family history. Family Psychiatric History: None reported Social History:  Social History   Substance and Sexual Activity  Alcohol Use Yes   Comment: Occassional      Social History   Substance and Sexual Activity  Drug Use Yes  . Types: Cocaine, Marijuana   Comment: pt reports she used last night     Social History   Socioeconomic History  . Marital status: Single    Spouse name: Not on file  . Number of children: Not on file  . Years of education: Not on file  . Highest education level: Not on file  Occupational History  . Not on file  Tobacco Use  . Smoking  status: Former Games developermoker  . Smokeless tobacco: Never Used  Substance and Sexual Activity  . Alcohol use: Yes    Comment: Occassional   . Drug use: Yes    Types: Cocaine, Marijuana    Comment: pt reports she used last night   . Sexual activity: Yes  Other Topics Concern  . Not on file  Social History Narrative  . Not on file   Social Determinants of Health   Financial Resource Strain:   . Difficulty of  Paying Living Expenses: Not on file  Food Insecurity:   . Worried About Programme researcher, broadcasting/film/videounning Out of Food in the Last Year: Not on file  . Ran Out of Food in the Last Year: Not on file  Transportation Needs:   . Lack of Transportation (Medical): Not on file  . Lack of Transportation (Non-Medical): Not on file  Physical Activity:   . Days of Exercise per Week: Not on file  . Minutes of Exercise per Session: Not on file  Stress:   . Feeling of Stress : Not on file  Social Connections:   . Frequency of Communication with Friends and Family: Not on file  . Frequency of Social Gatherings with Friends and Family: Not on file  . Attends Religious Services: Not on file  . Active Member of Clubs or Organizations: Not on file  . Attends BankerClub or Organization Meetings: Not on file  . Marital Status: Not on file   SDOH:  SDOH Screenings   Alcohol Screen:   . Last Alcohol Screening Score (AUDIT): Not on file  Depression (PHQ2-9):   . PHQ-2 Score: Not on file  Financial Resource Strain:   . Difficulty of Paying Living Expenses: Not on file  Food Insecurity:   . Worried About Programme researcher, broadcasting/film/videounning Out of Food in the Last Year: Not on file  . Ran Out of Food in the Last Year: Not on file  Housing:   . Last Housing Risk Score: Not on file  Physical Activity:   . Days of Exercise per Week: Not on file  . Minutes of Exercise per Session: Not on file  Social Connections:   . Frequency of Communication with Friends and Family: Not on file  . Frequency of Social Gatherings with Friends and Family: Not on file  . Attends Religious Services: Not on file  . Active Member of Clubs or Organizations: Not on file  . Attends BankerClub or Organization Meetings: Not on file  . Marital Status: Not on file  Stress:   . Feeling of Stress : Not on file  Tobacco Use: Medium Risk  . Smoking Tobacco Use: Former Smoker  . Smokeless Tobacco Use: Never Used  Transportation Needs:   . Freight forwarderLack of Transportation (Medical): Not on file  . Lack of  Transportation (Non-Medical): Not on file    Has this patient used any form of tobacco in the last 30 days? (Cigarettes, Smokeless Tobacco, Cigars, and/or Pipes) A prescription for an FDA-approved tobacco cessation medication was offered at discharge and the patient refused  Current Medications:  Current Facility-Administered Medications  Medication Dose Route Frequency Provider Last Rate Last Admin  . acetaminophen (TYLENOL) tablet 650 mg  650 mg Oral Q6H PRN Gillermo Murdochhompson, Jacqueline, NP      . alum & mag hydroxide-simeth (MAALOX/MYLANTA) 200-200-20 MG/5ML suspension 30 mL  30 mL Oral Q4H PRN Gillermo Murdochhompson, Jacqueline, NP      . cloNIDine (CATAPRES) tablet 0.1 mg  0.1 mg Oral QID Gillermo Murdochhompson, Jacqueline, NP   0.1  mg at 12/06/19 0142   Followed by  . [START ON 12/07/2019] cloNIDine (CATAPRES) tablet 0.1 mg  0.1 mg Oral Elpidio Eric, NP       Followed by  . [START ON 12/09/2019] cloNIDine (CATAPRES) tablet 0.1 mg  0.1 mg Oral QAC breakfast Gillermo Murdoch, NP      . dicyclomine (BENTYL) tablet 20 mg  20 mg Oral Q6H PRN Gillermo Murdoch, NP   20 mg at 12/05/19 1837  . diphenhydrAMINE (BENADRYL) capsule 25 mg  25 mg Oral QHS PRN Gillermo Murdoch, NP   25 mg at 12/05/19 2117  . loperamide (IMODIUM) capsule 2-4 mg  2-4 mg Oral PRN Gillermo Murdoch, NP      . magnesium hydroxide (MILK OF MAGNESIA) suspension 30 mL  30 mL Oral Daily PRN Gillermo Murdoch, NP      . methocarbamol (ROBAXIN) tablet 500 mg  500 mg Oral Q8H PRN Gillermo Murdoch, NP      . naproxen (NAPROSYN) tablet 500 mg  500 mg Oral BID PRN Gillermo Murdoch, NP      . nicotine (NICODERM CQ - dosed in mg/24 hours) patch 14 mg  14 mg Transdermal Once Rankin, Shuvon B, NP   14 mg at 12/05/19 1902  . ondansetron (ZOFRAN-ODT) disintegrating tablet 4 mg  4 mg Oral Q6H PRN Gillermo Murdoch, NP       Current Outpatient Medications  Medication Sig Dispense Refill  . fluconazole (DIFLUCAN) 150 MG tablet Take  1 tablet (150 mg total) by mouth daily. (Patient not taking: Reported on 12/05/2019) 2 tablet 0  . hydrOXYzine (ATARAX/VISTARIL) 25 MG tablet Take 1 tablet (25 mg total) by mouth every 6 (six) hours. (Patient not taking: Reported on 12/05/2019) 12 tablet 0  . metroNIDAZOLE (FLAGYL) 500 MG tablet Take 1 tablet (500 mg total) by mouth 2 (two) times daily. (Patient not taking: Reported on 10/06/2018) 14 tablet 0  . naproxen (NAPROSYN) 375 MG tablet Take 1 tablet (375 mg total) by mouth 2 (two) times daily. (Patient not taking: Reported on 12/05/2019) 20 tablet 0    PTA Medications: (Not in a hospital admission)   Musculoskeletal  Strength & Muscle Tone: within normal limits Gait & Station: normal Patient leans: N/A  Psychiatric Specialty Exam  Presentation  General Appearance: Appropriate for Environment;Casual  Eye Contact:Good  Speech:Clear and Coherent;Normal Rate  Speech Volume:Normal  Handedness:Right   Mood and Affect  Mood:Anxious  Affect:Congruent;Appropriate   Thought Process  Thought Processes:Coherent  Descriptions of Associations:Intact  Orientation:Full (Time, Place and Person)  Thought Content:WDL  Hallucinations:Hallucinations: None  Ideas of Reference:None  Suicidal Thoughts:Suicidal Thoughts: No SI Passive Intent and/or Plan: Without Intent;Without Plan  Homicidal Thoughts:Homicidal Thoughts: No   Sensorium  Memory:Immediate Good;Recent Good;Remote Good  Judgment:Good  Insight:Good   Executive Functions  Concentration:Good  Attention Span:Good  Recall:Good  Fund of Knowledge:Good  Language:Good   Psychomotor Activity  Psychomotor Activity:Psychomotor Activity: Normal   Assets  Assets:Communication Skills;Desire for Improvement;Housing;Social Support;Transportation   Sleep  Sleep:Sleep: Good Number of Hours of Sleep: -1   Physical Exam  Physical Exam Vitals and nursing note reviewed.  Constitutional:      Appearance:  She is well-developed.  Cardiovascular:     Rate and Rhythm: Normal rate.  Pulmonary:     Effort: Pulmonary effort is normal.  Musculoskeletal:        General: Normal range of motion.  Skin:    General: Skin is warm.  Neurological:     Mental Status: She is  alert and oriented to person, place, and time.  Psychiatric:        Mood and Affect: Mood is anxious.    Review of Systems  Constitutional: Negative.   HENT: Negative.   Eyes: Negative.   Respiratory: Negative.   Cardiovascular: Negative.   Gastrointestinal: Negative.   Genitourinary: Negative.   Musculoskeletal: Negative.   Skin: Negative.   Neurological: Negative.   Endo/Heme/Allergies: Negative.   Psychiatric/Behavioral: The patient is nervous/anxious.    Blood pressure 124/68, pulse 100, temperature 99.2 F (37.3 C), temperature source Oral, resp. rate 20, SpO2 100 %. There is no height or weight on file to calculate BMI.  Demographic Factors:  Adolescent or young adult  Loss Factors: NA  Historical Factors: NA  Risk Reduction Factors:   Living with another person, especially a relative, Positive social support and Positive therapeutic relationship  Continued Clinical Symptoms:  Alcohol/Substance Abuse/Dependencies  Cognitive Features That Contribute To Risk:  None    Suicide Risk:  Mild:  Suicidal ideation of limited frequency, intensity, duration, and specificity.  There are no identifiable plans, no associated intent, mild dysphoria and related symptoms, good self-control (both objective and subjective assessment), few other risk factors, and identifiable protective factors, including available and accessible social support.  Plan Of Care/Follow-up recommendations:  Continue activity as tolerated. Continue diet as recommended by your PCP. Ensure to keep all appointments with outpatient providers.  Disposition: Discharge to mother with planned follow up with ED due to abdominal pain  Maryfrances Bunnell,  FNP 12/06/2019, 8:34 AM

## 2019-12-07 ENCOUNTER — Ambulatory Visit (HOSPITAL_COMMUNITY): Payer: No Payment, Other | Admitting: Behavioral Health

## 2020-01-10 ENCOUNTER — Ambulatory Visit (HOSPITAL_COMMUNITY)
Admission: RE | Admit: 2020-01-10 | Discharge: 2020-01-10 | Disposition: A | Discharge status: Home / Self Care | Payer: No Payment, Other | Attending: Psychiatry | Admitting: Psychiatry

## 2020-01-10 DIAGNOSIS — F1113 Opioid abuse with withdrawal: Secondary | ICD-10-CM | POA: Insufficient documentation

## 2020-01-10 DIAGNOSIS — F329 Major depressive disorder, single episode, unspecified: Secondary | ICD-10-CM | POA: Insufficient documentation

## 2020-01-10 DIAGNOSIS — Z87891 Personal history of nicotine dependence: Secondary | ICD-10-CM | POA: Insufficient documentation
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
## 2020-04-13 DIAGNOSIS — U071 COVID-19: Secondary | ICD-10-CM | POA: Diagnosis not present

## 2020-04-13 DIAGNOSIS — M545 Low back pain, unspecified: Secondary | ICD-10-CM | POA: Diagnosis not present

## 2020-04-13 DIAGNOSIS — J45909 Unspecified asthma, uncomplicated: Secondary | ICD-10-CM | POA: Diagnosis not present

## 2020-04-13 DIAGNOSIS — Z87891 Personal history of nicotine dependence: Secondary | ICD-10-CM | POA: Diagnosis not present

## 2020-04-13 DIAGNOSIS — R509 Fever, unspecified: Secondary | ICD-10-CM | POA: Diagnosis present

## 2020-04-13 LAB — URINALYSIS, ROUTINE W REFLEX MICROSCOPIC
Bilirubin Urine: NEGATIVE
Glucose, UA: NEGATIVE mg/dL
Hgb urine dipstick: NEGATIVE
Ketones, ur: 5 mg/dL — AB
Leukocytes,Ua: NEGATIVE
Nitrite: NEGATIVE
Protein, ur: NEGATIVE mg/dL
Specific Gravity, Urine: 1.024 (ref 1.005–1.030)
pH: 5 (ref 5.0–8.0)

## 2020-04-13 LAB — POC URINE PREG, ED: Preg Test, Ur: NEGATIVE

## 2020-04-13 MED ORDER — ACETAMINOPHEN 325 MG PO TABS
650.0000 mg | ORAL_TABLET | Freq: Once | ORAL | Status: AC | PRN
  Administered 2020-04-13: 650 mg via ORAL
  Filled 2020-04-13: qty 2

## 2020-04-13 NOTE — ED Triage Notes (Addendum)
Pt reports general lower back pain x16 days. Reports fever for the last 5. States that it does not feel kidney related. Tried a percocet with fentanyl 2 days ago without relief. Tearful in triage. Denies pregnancy.

## 2020-04-14 ENCOUNTER — Emergency Department (HOSPITAL_COMMUNITY): Payer: HRSA Program

## 2020-04-14 ENCOUNTER — Emergency Department (HOSPITAL_COMMUNITY)
Admission: EM | Admit: 2020-04-14 | Discharge: 2020-04-14 | Disposition: A | Discharge status: Home / Self Care | Payer: HRSA Program | Attending: Emergency Medicine | Admitting: Emergency Medicine

## 2020-04-14 DIAGNOSIS — R509 Fever, unspecified: Secondary | ICD-10-CM

## 2020-04-14 DIAGNOSIS — Z20822 Contact with and (suspected) exposure to covid-19: Secondary | ICD-10-CM

## 2020-04-14 DIAGNOSIS — M545 Low back pain, unspecified: Secondary | ICD-10-CM

## 2020-04-14 LAB — LIPASE, BLOOD: Lipase: 24 U/L (ref 11–51)

## 2020-04-14 LAB — CBC WITH DIFFERENTIAL/PLATELET
Abs Immature Granulocytes: 0.04 10*3/uL (ref 0.00–0.07)
Basophils Absolute: 0 10*3/uL (ref 0.0–0.1)
Basophils Relative: 0 %
Eosinophils Absolute: 0 10*3/uL (ref 0.0–0.5)
Eosinophils Relative: 0 %
HCT: 36.8 % (ref 36.0–46.0)
Hemoglobin: 12.1 g/dL (ref 12.0–15.0)
Immature Granulocytes: 1 %
Lymphocytes Relative: 5 %
Lymphs Abs: 0.3 10*3/uL — ABNORMAL LOW (ref 0.7–4.0)
MCH: 30.1 pg (ref 26.0–34.0)
MCHC: 32.9 g/dL (ref 30.0–36.0)
MCV: 91.5 fL (ref 80.0–100.0)
Monocytes Absolute: 0.6 10*3/uL (ref 0.1–1.0)
Monocytes Relative: 9 %
Neutro Abs: 5.9 10*3/uL (ref 1.7–7.7)
Neutrophils Relative %: 85 %
Platelets: 178 10*3/uL (ref 150–400)
RBC: 4.02 MIL/uL (ref 3.87–5.11)
RDW: 13.5 % (ref 11.5–15.5)
WBC: 6.9 10*3/uL (ref 4.0–10.5)
nRBC: 0 % (ref 0.0–0.2)

## 2020-04-14 LAB — COMPREHENSIVE METABOLIC PANEL
ALT: 11 U/L (ref 0–44)
AST: 18 U/L (ref 15–41)
Albumin: 4.4 g/dL (ref 3.5–5.0)
Alkaline Phosphatase: 78 U/L (ref 38–126)
Anion gap: 11 (ref 5–15)
BUN: 9 mg/dL (ref 6–20)
CO2: 25 mmol/L (ref 22–32)
Calcium: 8.7 mg/dL — ABNORMAL LOW (ref 8.9–10.3)
Chloride: 98 mmol/L (ref 98–111)
Creatinine, Ser: 0.73 mg/dL (ref 0.44–1.00)
GFR, Estimated: >60 mL/min (ref >60)
Glucose, Bld: 89 mg/dL (ref 70–99)
Potassium: 3.3 mmol/L — ABNORMAL LOW (ref 3.5–5.1)
Sodium: 134 mmol/L — ABNORMAL LOW (ref 135–145)
Total Bilirubin: 0.4 mg/dL (ref 0.3–1.2)
Total Protein: 8.1 g/dL (ref 6.5–8.1)

## 2020-04-14 LAB — WET PREP, GENITAL
Sperm: NONE SEEN
Trich, Wet Prep: NONE SEEN
Yeast Wet Prep HPF POC: NONE SEEN

## 2020-04-14 LAB — HIV ANTIBODY (ROUTINE TESTING W REFLEX): HIV Screen 4th Generation wRfx: NONREACTIVE

## 2020-04-14 LAB — RESP PANEL BY RT-PCR (FLU A&B, COVID) ARPGX2
Influenza A by PCR: NEGATIVE
Influenza B by PCR: NEGATIVE
SARS Coronavirus 2 by RT PCR: POSITIVE — AB

## 2020-04-14 LAB — RAPID HIV SCREEN (HIV 1/2 AB+AG)
HIV 1/2 Antibodies: NONREACTIVE
HIV-1 P24 Antigen - HIV24: NONREACTIVE

## 2020-04-14 LAB — POC SARS CORONAVIRUS 2 AG -  ED: SARS Coronavirus 2 Ag: NEGATIVE

## 2020-04-14 MED ORDER — GADOBUTROL 1 MMOL/ML IV SOLN
7.5000 mL | Freq: Once | INTRAVENOUS | Status: AC | PRN
  Administered 2020-04-14: 7.5 mL via INTRAVENOUS

## 2020-04-14 MED ORDER — OXYCODONE-ACETAMINOPHEN 5-325 MG PO TABS
2.0000 | ORAL_TABLET | Freq: Once | ORAL | Status: AC
  Administered 2020-04-14: 2 via ORAL
  Filled 2020-04-14: qty 2

## 2020-04-14 MED ORDER — SODIUM CHLORIDE 0.9 % IV SOLN: Freq: Once | INTRAVENOUS | Status: AC

## 2020-04-14 MED ORDER — METHOCARBAMOL 500 MG PO TABS: 500.0000 mg | ORAL_TABLET | Freq: Two times a day (BID) | ORAL | 0 refills | Status: DC

## 2020-04-14 MED ORDER — FENTANYL CITRATE (PF) 100 MCG/2ML IJ SOLN
50.0000 ug | INTRAMUSCULAR | Status: DC | PRN
  Administered 2020-04-14: 50 ug via INTRAVENOUS
  Filled 2020-04-14: qty 2

## 2020-04-14 MED ORDER — PIPERACILLIN-TAZOBACTAM 3.375 G IVPB 30 MIN
3.3750 g | Freq: Once | INTRAVENOUS | Status: AC
  Administered 2020-04-14: 3.375 g via INTRAVENOUS
  Filled 2020-04-14: qty 50

## 2020-04-14 MED ORDER — VANCOMYCIN HCL IN DEXTROSE 1-5 GM/200ML-% IV SOLN
1000.0000 mg | Freq: Once | INTRAVENOUS | Status: AC
  Administered 2020-04-14: 1000 mg via INTRAVENOUS
  Filled 2020-04-14: qty 200

## 2020-04-14 NOTE — Discharge Instructions (Addendum)
Your work-up today was reassuring.  Your MRI and CT scans were negative for any intra-abdominal infection or any spinal infection.  Please follow-up with a primary care doctor.  I given you information for the [facility] please call to make appointment and if you do not have a primary care doctor.  Please use Tylenol or ibuprofen for pain.  You may use 600 mg ibuprofen every 6 hours or 1000 mg of Tylenol every 6 hours.  You may choose to alternate between the 2.  This would be most effective.  Not to exceed 4 g of Tylenol within 24 hours.  Not to exceed 3200 mg ibuprofen 24 hours. I have also prescribed you a muscle relaxer.    You may always return to the ER for any new or concerning symptoms.

## 2020-04-14 NOTE — ED Notes (Signed)
Called Kendal Hymen, MRI tech, CT Medical laboratory scientific officer of MRI.

## 2020-04-14 NOTE — ED Notes (Signed)
Wylder, PA made aware of patient negative poc covid.

## 2020-04-14 NOTE — ED Notes (Signed)
Patient transported to MRI 

## 2020-04-14 NOTE — ED Notes (Signed)
Went to update vitals. Patient is still gone for testing.

## 2020-04-14 NOTE — ED Provider Notes (Signed)
[facility]-EMERGENCY DEPT Provider Note   CSN: [id number] Arrival date & time: 04/13/20  2149     History Chief Complaint  Patient presents with  . Back Pain  . Fever    Tracey Lam is a 25 y.o. female.  HPI Patient is a 25 year old female with past medical history of substance abuse, dependence and withdrawal  Constant progressively worse severe low back pain.  She denies any history of injection drug use although she does have a history of polysubstance abuse.  She denies any immunosuppressive diseases that she knows of.  She does not regularly see a primary care doctor.  She denies any spinal surgeries.  She states that she has had a fever for the past 5 days she states that she has tried taking Tylenol ibuprofen has not helped.  She states she has absolutely no nausea, vomiting, chest pain, abdominal pain, shortness of breath, congestion, cough, myalgias--apart from back pain--and states that she took a home Covid test which was negative.  She states that the pain is worse with movement.  It seems to be constant at times where she is resting wheezing.  No radiation of pain.  She denies any trauma.  She denies neurologic changes such as sensation change or weakness lower extremities, coagulopathy or blood thinner use, is not elderly or with history of osteoporosis, denies any history of cancer, IV drug use, weight changes (unexplained), or prolonged steroid use.      Past Medical History:  Diagnosis Date  . Asthma     Patient Active Problem List   Diagnosis Date Noted  . Current severe episode of major depressive disorder without psychotic features without prior episode (HCC) 12/04/2019  . Drug dependence, abuse (HCC) 12/04/2019  . Drug withdrawal syndrome (HCC) 12/04/2019    No past surgical history on file.   OB History   No obstetric history on file.     No family history on file.  Social History   Tobacco Use  . Smoking status: Former  Games developer  . Smokeless tobacco: Never Used  Substance Use Topics  . Alcohol use: Yes    Comment: Occassional   . Drug use: Yes    Types: Cocaine, Marijuana    Comment: pt reports she used last night     Home Medications Prior to Admission medications   Medication Sig Start Date End Date Taking? Authorizing Provider  methocarbamol (ROBAXIN) 500 MG tablet Take 1 tablet (500 mg total) by mouth 2 (two) times daily. 04/14/20  Yes Raelle Chambers, Stevphen Meuse S, PA  cloNIDine (CATAPRES) 0.1 MG tablet Take 1 tablet (0.1 mg total) by mouth 2 (two) times daily in the am and at bedtime.. 1 tablet 4 times a day x 1 day then 1 tablet a day x 1 day 12/07/19   Money, Gerlene Burdock, FNP    Allergies    Patient has no known allergies.  Review of Systems   Review of Systems  Constitutional: Positive for fever. Negative for chills.  HENT: Negative for congestion, ear pain, postnasal drip, rhinorrhea, sinus pain, sneezing, sore throat and voice change.   Eyes: Negative for pain.  Respiratory: Negative for cough and shortness of breath.   Cardiovascular: Negative for chest pain and leg swelling.  Gastrointestinal: Negative for abdominal pain, diarrhea, nausea and vomiting.  Genitourinary: Negative for dysuria.  Musculoskeletal: Positive for back pain. Negative for myalgias.  Skin: Negative for rash.  Neurological: Negative for dizziness and headaches.    Physical Exam Updated Vital  Signs BP 114/78   Pulse 84   Temp 98.8 F (37.1 C) (Oral)   Resp 18   SpO2 100%   Physical Exam Vitals and nursing note reviewed.  Constitutional:      General: She is in acute distress.     Appearance: She is obese.  HENT:     Head: Normocephalic and atraumatic.     Nose: Nose normal.     Mouth/Throat:     Mouth: Mucous membranes are moist.  Eyes:     General: No scleral icterus. Cardiovascular:     Rate and Rhythm: Regular rhythm. Tachycardia present.     Pulses: Normal pulses.     Heart sounds: Normal heart sounds.   Pulmonary:     Effort: Pulmonary effort is normal. No respiratory distress.     Breath sounds: Normal breath sounds. No wheezing.  Abdominal:     Palpations: Abdomen is soft.     Tenderness: There is no abdominal tenderness. There is no guarding or rebound.  Genitourinary:    Comments: Vulva without lesions or abnormality Vaginal canal without abnormal discharge or lesion Cervix appears normal, is closed No adnexal tenderness or CMT  Musculoskeletal:     Cervical back: Normal range of motion.     Right lower leg: No edema.     Left lower leg: No edema.     Comments: Severe midline tenderness to palpation in the lumbar region approximately L4-L5.  No other tenderness to palpation of the midline spine.  Mild diffuse right-sided muscular tenderness to palpation.  Moves all four extremities.   Skin:    General: Skin is warm and dry.     Capillary Refill: Capillary refill takes less than 2 seconds.     Comments: No rash or skin changes. No abscess  Neurological:     Mental Status: She is alert. Mental status is at baseline.     Comments: Alert and oriented to self, place, time and event.   Speech is fluent, clear without dysarthria or dysphasia.   Strength 5/5 in upper/lower extremities  Sensation intact in upper/lower extremities   Normal finger-to-nose and feet tapping.  CN I not tested  CN II grossly intact visual fields bilaterally. Did not visualize posterior eye.   CN III, IV, VI PERRLA and EOMs intact bilaterally  CN V Intact sensation to sharp and light touch to the face  CN VII facial movements symmetric  CN VIII not tested  CN IX, X no uvula deviation, symmetric rise of soft palate  CN XI 5/5 SCM and trapezius strength bilaterally  CN XII Midline tongue protrusion, symmetric L/R movements   Psychiatric:        Mood and Affect: Mood normal.        Behavior: Behavior normal.     ED Results / Procedures / Treatments   Labs (all labs ordered are listed, but only  abnormal results are displayed) Labs Reviewed  WET PREP, GENITAL - Abnormal; Notable for the following components:      Result Value   Clue Cells Wet Prep HPF POC PRESENT (*)    WBC, Wet Prep HPF POC FEW (*)    All other components within normal limits  RESP PANEL BY RT-PCR (FLU A&B, COVID) ARPGX2 - Abnormal; Notable for the following components:   SARS Coronavirus 2 by RT PCR POSITIVE (*)    All other components within normal limits  URINALYSIS, ROUTINE W REFLEX MICROSCOPIC - Abnormal; Notable for the following components:  APPearance HAZY (*)    Ketones, ur 5 (*)    All other components within normal limits  CBC WITH DIFFERENTIAL/PLATELET - Abnormal; Notable for the following components:   Lymphs Abs 0.3 (*)    All other components within normal limits  COMPREHENSIVE METABOLIC PANEL - Abnormal; Notable for the following components:   Sodium 134 (*)    Potassium 3.3 (*)    Calcium 8.7 (*)    All other components within normal limits  URINE CULTURE  CULTURE, BLOOD (ROUTINE X 2)  CULTURE, BLOOD (ROUTINE X 2)  LIPASE, BLOOD  RPR  HIV ANTIBODY (ROUTINE TESTING W REFLEX)  RAPID HIV SCREEN (HIV 1/2 AB+AG)  POC URINE PREG, ED  POC SARS CORONAVIRUS 2 AG -  ED  GC/CHLAMYDIA PROBE AMP ([place]) NOT AT [facility]    EKG None  Radiology MR THORACIC SPINE W WO CONTRAST  Result Date: 04/14/2020 CLINICAL DATA:  Back pain for 2 weeks.  Fever for 5 days. EXAM: MRI THORACIC AND LUMBAR SPINE WITHOUT AND WITH CONTRAST TECHNIQUE: Multiplanar and multiecho pulse sequences of the thoracic and lumbar spine were obtained without and with intravenous contrast. CONTRAST:  7.49mL GADAVIST GADOBUTROL 1 MMOL/ML IV SOLN COMPARISON:  Same day lumbar spine CT.  X-ray 08/15/2014 FINDINGS: MRI THORACIC SPINE FINDINGS Alignment:  Normal thoracic kyphosis.  No static listhesis. Vertebrae: No fracture, evidence of discitis, or bone lesion. Cord:  Normal signal and morphology. Paraspinal and other soft tissues:  Negative. Disc levels: Intervertebral discs are well hydrated without disc desiccation. No focal disc protrusion. Unremarkable facet joints. No significant degenerative findings within the thoracic spine. No foraminal or canal stenosis at any level. MRI LUMBAR SPINE FINDINGS Segmentation:  Standard. Alignment:  Normal lumbar lordosis without static listhesis. Vertebrae:  No fracture, evidence of discitis, or bone lesion. Conus medullaris: Extends to the T12-L1 level and appears normal. Paraspinal and other soft tissues: Retroverted uterus. No acute findings. Disc levels: T12-L1: Normal. L1-L2: Normal. L2-L3: Normal. L3-L4: Normal. L4-L5: Mild circumferential disc bulge with posterior annular fissure. Borderline-mild bilateral foraminal stenosis, left greater than right. No canal stenosis. L5-S1: Normal. IMPRESSION: 1. No evidence of discitis-osteomyelitis or other acute osseous abnormality within the thoracic or lumbar spine. 2. Mild degenerative disc disease at L4-L5 with posterior annular fissure and borderline-mild bilateral foraminal stenosis, left greater than right. 3. No canal stenosis at any level. Electronically Signed   By: Duanne Guess D.O.   On: 04/14/2020 13:12   MR Lumbar Spine W Wo Contrast  Result Date: 04/14/2020 CLINICAL DATA:  Back pain for 2 weeks.  Fever for 5 days. EXAM: MRI THORACIC AND LUMBAR SPINE WITHOUT AND WITH CONTRAST TECHNIQUE: Multiplanar and multiecho pulse sequences of the thoracic and lumbar spine were obtained without and with intravenous contrast. CONTRAST:  7.69mL GADAVIST GADOBUTROL 1 MMOL/ML IV SOLN COMPARISON:  Same day lumbar spine CT.  X-ray 08/15/2014 FINDINGS: MRI THORACIC SPINE FINDINGS Alignment:  Normal thoracic kyphosis.  No static listhesis. Vertebrae: No fracture, evidence of discitis, or bone lesion. Cord:  Normal signal and morphology. Paraspinal and other soft tissues: Negative. Disc levels: Intervertebral discs are well hydrated without disc desiccation.  No focal disc protrusion. Unremarkable facet joints. No significant degenerative findings within the thoracic spine. No foraminal or canal stenosis at any level. MRI LUMBAR SPINE FINDINGS Segmentation:  Standard. Alignment:  Normal lumbar lordosis without static listhesis. Vertebrae:  No fracture, evidence of discitis, or bone lesion. Conus medullaris: Extends to the T12-L1 level and appears normal. Paraspinal and  other soft tissues: Retroverted uterus. No acute findings. Disc levels: T12-L1: Normal. L1-L2: Normal. L2-L3: Normal. L3-L4: Normal. L4-L5: Mild circumferential disc bulge with posterior annular fissure. Borderline-mild bilateral foraminal stenosis, left greater than right. No canal stenosis. L5-S1: Normal. IMPRESSION: 1. No evidence of discitis-osteomyelitis or other acute osseous abnormality within the thoracic or lumbar spine. 2. Mild degenerative disc disease at L4-L5 with posterior annular fissure and borderline-mild bilateral foraminal stenosis, left greater than right. 3. No canal stenosis at any level. Electronically Signed   By: Duanne GuessNicholas  Plundo D.O.   On: 04/14/2020 13:12   CT L-SPINE NO CHARGE  Result Date: 04/14/2020 CLINICAL DATA:  25 year old female with 16 days of low back pain. Fever for 5 days. EXAM: CT LUMBAR SPINE WITHOUT CONTRAST TECHNIQUE: Technique: Multiplanar CT images of the lumbar spine were reconstructed from contemporary CT of the Abdomen and Pelvis. CONTRAST:  No additional COMPARISON:  CT Abdomen and Pelvis today are reported separately. FINDINGS: Segmentation: Normal. Alignment: Mild straightening of lumbar lordosis. No spondylolisthesis. Vertebrae: Bone mineralization is within normal limits. Lumbar vertebrae appear intact and within normal limits. No acute osseous abnormality identified. Intact visible sacrum and SI joints. Paraspinal and other soft tissues: Lumbar paraspinal soft tissues appear negative. Abdomen and pelvis viscera reported separately. Disc levels:  T12-L1:  Negative. L1-L2:  Negative. L2-L3:  Minor endplate spurring.  Otherwise negative. L3-L4: Evidence of mild disc bulging. Possible mild left L3 foraminal stenosis. Otherwise negative. L4-L5: Evidence of mild disc bulging. Subtle endplate spurring. No definite stenosis. L5-S1:  Negative. IMPRESSION: 1. Negative CT appearance of the lumbar spine aside from possible mild disc bulging at several levels, questionable mild left L3 neural foraminal stenosis. 2. CT Abdomen and Pelvis today reported separately. Electronically Signed   By: Odessa FlemingH  Hall M.D.   On: 04/14/2020 08:31   CT Renal Stone Study  Result Date: 04/14/2020 CLINICAL DATA:  25 year old female with 16 days of low back pain. Fever for 5 days. EXAM: CT ABDOMEN AND PELVIS WITHOUT CONTRAST TECHNIQUE: Multidetector CT imaging of the abdomen and pelvis was performed following the standard protocol without IV contrast. COMPARISON:  Pelvis ultrasound 12/12/2015. Lumbar spine CT today reported separately. FINDINGS: Lower chest: Normal lung bases. Cardiac size at the upper limits of normal. No pericardial or pleural effusion. Hepatobiliary: Negative noncontrast liver and gallbladder. Pancreas: Negative. Spleen: Negative. Adrenals/Urinary Tract: Normal adrenal glands. Noncontrast kidneys appear symmetric and within normal limits. No definite nephrolithiasis. Proximal ureters appear decompressed. No periureteral stranding identified. Diminutive and unremarkable urinary bladder. A punctate right hemipelvis phlebolith is suspected (series 1, image 71). Stomach/Bowel: No dilated large or small bowel. No inflamed large bowel. Normal appendix on series 1, image 61, coronal image 68 which courses toward the right adnexa. Negative terminal ileum. Decompressed stomach. No free air, free fluid, or mesenteric stranding identified. Vascular/Lymphatic: Normal caliber abdominal aorta. No calcified atherosclerosis identified. Vascular patency is not evaluated in the absence of  IV contrast. No lymphadenopathy is evident. Reproductive: Negative noncontrast appearance. Other: No pelvic free fluid. Musculoskeletal: No acute osseous abnormality identified. Lumbar spine CT today reported separately. IMPRESSION: 1. Negative noncontrast CT Abdomen and Pelvis. Normal appendix. No definite urinary calculus. 2. Lumbar spine CT today reported separately. Electronically Signed   By: Odessa FlemingH  Hall M.D.   On: 04/14/2020 08:27    Procedures Procedures (including critical care time)  Medications Ordered in ED Medications  acetaminophen (TYLENOL) tablet 650 mg (650 mg Oral Given 04/13/20 2321)  oxyCODONE-acetaminophen (PERCOCET/ROXICET) 5-325 MG per tablet 2 tablet (2  tablets Oral Given 04/14/20 0806)  0.9 %  sodium chloride infusion ( Intravenous Stopped 04/14/20 1345)  vancomycin (VANCOCIN) IVPB 1000 mg/200 mL premix (0 mg Intravenous Stopped 04/14/20 1112)  piperacillin-tazobactam (ZOSYN) IVPB 3.375 g (0 g Intravenous Stopped 04/14/20 0845)  gadobutrol (GADAVIST) 1 MMOL/ML injection 7.5 mL (7.5 mLs Intravenous Contrast Given 04/14/20 1228)    ED Course  I have reviewed the triage vital signs and the nursing notes.  Pertinent labs & imaging results that were available during my care of the patient were reviewed by me and considered in my medical decision making (see chart for details).  Constant progressively worse severe low back pain.  She denies any history of injection drug use although she does have a history of polysubstance abuse.  She denies any immunosuppressive diseases that she knows of.  She does not regularly see a primary care doctor.  She denies any spinal surgeries.  She states that she has had a fever for the past 5 days she states that she has tried taking Tylenol ibuprofen has not helped.  She states she has absolutely no nausea, vomiting, chest pain, abdominal pain, shortness of breath, congestion, cough, myalgias--apart from back pain--and states that she took a home Covid  test which was negative.  Initial suspicions for viral process and complement back pain however I do have some concern for abscess.  Physical exam she does have focal lumbar midline tenderness palpation.  I also did pelvic exam to assess for any concern for PID cervical motion tenderness however.  I discussed this case with my attending physician who cosigned this note including patient's presenting symptoms, physical exam, and planned diagnostics and interventions. Attending physician stated agreement with plan or made changes to plan which were implemented.   Attending physician assessed patient at bedside.  Plan at this time is to obtain MRI lumbar and thoracic spine .  In the interim we will obtain CT and CT L-spine.  These were without any abnormalities at all. We will also provide patient with broad-spectrum antibiotics.  Patient was given Tylenol in triage which improved her fevers.  It is been greater than 6 hours since her last Tylenol will provide patient with Percocet which contains 650 of Tylenol.  We will also give patient 1 L of IV fluids.  MRI shows no acute changes present.  Printed this information for the patient handed to her.CBC without leukocytosis or anemia CMP with mild low K+ no indication for tx Lipase within normal limits. Blood cultures pending.  HIV nonreactive.  RPR negative.  Wet prep with few WBCs and clue cells present.  She is having no vaginal complaints -- shared conversation with pt she prefers no tx I doubt this as source of fever.    Clinical Course as of 04/15/20 1624  Sat Apr 14, 2020  4008 CT L spine IMPRESSION: 1. Negative CT appearance of the lumbar spine aside from possible mild disc bulging at several levels, questionable mild left L3 neural foraminal stenosis. 2. CT Abdomen and Pelvis today reported separately. [WF]  [phone] CT renal stone study IMPRESSION: 1. Negative noncontrast CT Abdomen and Pelvis. Normal appendix. No definite urinary  calculus. 2. Lumbar spine CT today reported separately. [WF]  1317 MRI without discuitis or spinal epidural abscess   IMPRESSION: 1. No evidence of discitis-osteomyelitis or other acute osseous abnormality within the thoracic or lumbar spine. 2. Mild degenerative disc disease at L4-L5 with posterior annular fissure and borderline-mild bilateral foraminal stenosis, left greater than right.  3. No canal stenosis at any level.   [WF]    Clinical Course User Index [WF] Gailen Shelter, PA   MDM Rules/Calculators/A&P                          Discussed patient with attending physician just prior to discharge to confirm plan.  We will discharge home with Tylenol and ibuprofen.  She will follow up with her primary care doctor I also provided her with Robaxin for muscle relaxer.  She tells completely better at this time.  She has received some fentanyl.  Her vital signs are normal limits.  Given aspirin with no evidence of minimal dialysis but she has 2 separate issues fever likely from some viral process rapid Covid test was negative however sent home with pending 24-hour test.  Her back pain is likely musculoskeletal perhaps related seen for degenerative disease although she is young.  Acute emergent causes of back pain has been ruled out at this time.  She will follow up with PCP.  Given return cautions.   At time of this note completion 24 hour PCR covid test has resulted positive.  Final Clinical Impression(s) / ED Diagnoses Final diagnoses:  Low back pain  Fever, unspecified fever cause  Suspected COVID-19 virus infection    Rx / DC Orders ED Discharge Orders         Ordered    methocarbamol (ROBAXIN) 500 MG tablet  2 times daily        04/14/20 1334           Solon [place]a [place], [place] 04/15/20 1629    Margarita Grizzle, MD 04/15/20 [phone]

## 2020-04-14 NOTE — ED Notes (Signed)
An After Visit Summary was printed and given to the patient. Discharge instructions given and no further questions at this time.  

## 2020-04-15 LAB — URINE CULTURE: Culture: NO GROWTH

## 2020-04-15 LAB — RPR: RPR Ser Ql: NONREACTIVE

## 2020-04-16 LAB — GC/CHLAMYDIA PROBE AMP (~~LOC~~) NOT AT ARMC
Chlamydia: POSITIVE — AB
Comment: NEGATIVE
Comment: NORMAL
Neisseria Gonorrhea: NEGATIVE

## 2020-04-19 LAB — CULTURE, BLOOD (ROUTINE X 2)
Culture: NO GROWTH
Culture: NO GROWTH
Special Requests: ADEQUATE
Special Requests: ADEQUATE

## 2020-04-25 ENCOUNTER — Telehealth (HOSPITAL_COMMUNITY): Payer: Self-pay

## 2021-08-12 DIAGNOSIS — R456 Violent behavior: Secondary | ICD-10-CM | POA: Diagnosis not present

## 2021-08-12 DIAGNOSIS — R69 Illness, unspecified: Secondary | ICD-10-CM | POA: Diagnosis not present

## 2021-08-12 DIAGNOSIS — R443 Hallucinations, unspecified: Secondary | ICD-10-CM | POA: Diagnosis not present

## 2021-08-12 DIAGNOSIS — R000 Tachycardia, unspecified: Secondary | ICD-10-CM | POA: Diagnosis not present

## 2021-09-07 ENCOUNTER — Emergency Department (HOSPITAL_COMMUNITY)
Admission: EM | Admit: 2021-09-07 | Discharge: 2021-09-07 | Disposition: A | Discharge status: Home / Self Care | Payer: 59 | Attending: Emergency Medicine | Admitting: Emergency Medicine

## 2021-09-07 ENCOUNTER — Other Ambulatory Visit: Payer: Self-pay

## 2021-09-07 ENCOUNTER — Emergency Department (HOSPITAL_COMMUNITY): Payer: 59

## 2021-09-07 ENCOUNTER — Encounter (HOSPITAL_COMMUNITY): Payer: Self-pay | Admitting: *Deleted

## 2021-09-07 DIAGNOSIS — J029 Acute pharyngitis, unspecified: Secondary | ICD-10-CM

## 2021-09-07 DIAGNOSIS — J36 Peritonsillar abscess: Secondary | ICD-10-CM | POA: Diagnosis not present

## 2021-09-07 DIAGNOSIS — Z20822 Contact with and (suspected) exposure to covid-19: Secondary | ICD-10-CM | POA: Insufficient documentation

## 2021-09-07 DIAGNOSIS — J45909 Unspecified asthma, uncomplicated: Secondary | ICD-10-CM | POA: Insufficient documentation

## 2021-09-07 DIAGNOSIS — R000 Tachycardia, unspecified: Secondary | ICD-10-CM | POA: Insufficient documentation

## 2021-09-07 LAB — COMPREHENSIVE METABOLIC PANEL
ALT: 11 U/L (ref 0–44)
AST: 16 U/L (ref 15–41)
Albumin: 3.7 g/dL (ref 3.5–5.0)
Alkaline Phosphatase: 75 U/L (ref 38–126)
Anion gap: 10 (ref 5–15)
BUN: 6 mg/dL (ref 6–20)
CO2: 24 mmol/L (ref 22–32)
Calcium: 8.9 mg/dL (ref 8.9–10.3)
Chloride: 100 mmol/L (ref 98–111)
Creatinine, Ser: 0.62 mg/dL (ref 0.44–1.00)
GFR, Estimated: >60 mL/min (ref >60)
Glucose, Bld: 143 mg/dL — ABNORMAL HIGH (ref 70–99)
Potassium: 3.3 mmol/L — ABNORMAL LOW (ref 3.5–5.1)
Sodium: 134 mmol/L — ABNORMAL LOW (ref 135–145)
Total Bilirubin: 0.6 mg/dL (ref 0.3–1.2)
Total Protein: 8.3 g/dL — ABNORMAL HIGH (ref 6.5–8.1)

## 2021-09-07 LAB — CBC WITH DIFFERENTIAL/PLATELET
Abs Immature Granulocytes: 0.18 10*3/uL — ABNORMAL HIGH (ref 0.00–0.07)
Basophils Absolute: 0.1 10*3/uL (ref 0.0–0.1)
Basophils Relative: 0 %
Eosinophils Absolute: 0.1 10*3/uL (ref 0.0–0.5)
Eosinophils Relative: 1 %
HCT: 35.3 % — ABNORMAL LOW (ref 36.0–46.0)
Hemoglobin: 11.8 g/dL — ABNORMAL LOW (ref 12.0–15.0)
Immature Granulocytes: 1 %
Lymphocytes Relative: 11 %
Lymphs Abs: 2.2 10*3/uL (ref 0.7–4.0)
MCH: 29.1 pg (ref 26.0–34.0)
MCHC: 33.4 g/dL (ref 30.0–36.0)
MCV: 87.2 fL (ref 80.0–100.0)
Monocytes Absolute: 1.9 10*3/uL — ABNORMAL HIGH (ref 0.1–1.0)
Monocytes Relative: 9 %
Neutro Abs: 15.6 10*3/uL — ABNORMAL HIGH (ref 1.7–7.7)
Neutrophils Relative %: 78 %
Platelets: 306 10*3/uL (ref 150–400)
RBC: 4.05 MIL/uL (ref 3.87–5.11)
RDW: 13.2 % (ref 11.5–15.5)
WBC: 20 10*3/uL — ABNORMAL HIGH (ref 4.0–10.5)
nRBC: 0 % (ref 0.0–0.2)

## 2021-09-07 LAB — SARS CORONAVIRUS 2 BY RT PCR: SARS Coronavirus 2 by RT PCR: NEGATIVE

## 2021-09-07 LAB — LIPASE, BLOOD: Lipase: 24 U/L (ref 11–51)

## 2021-09-07 LAB — I-STAT BETA HCG BLOOD, ED (MC, WL, AP ONLY): I-stat hCG, quantitative: <5 m[IU]/mL (ref <5)

## 2021-09-07 LAB — MONONUCLEOSIS SCREEN: Mono Screen: NEGATIVE

## 2021-09-07 LAB — LACTIC ACID, PLASMA: Lactic Acid, Venous: 1.1 mmol/L (ref 0.5–1.9)

## 2021-09-07 LAB — GROUP A STREP BY PCR: Group A Strep by PCR: NOT DETECTED

## 2021-09-07 MED ORDER — OXYCODONE-ACETAMINOPHEN 5-325 MG PO TABS: 1.0000 | ORAL_TABLET | ORAL | 0 refills | Status: DC | PRN

## 2021-09-07 MED ORDER — CLINDAMYCIN PHOSPHATE 900 MG/50ML IV SOLN
900.0000 mg | Freq: Once | INTRAVENOUS | Status: AC
  Administered 2021-09-07: 900 mg via INTRAVENOUS
  Filled 2021-09-07: qty 50

## 2021-09-07 MED ORDER — DEXAMETHASONE SODIUM PHOSPHATE 10 MG/ML IJ SOLN
10.0000 mg | Freq: Once | INTRAMUSCULAR | Status: AC
  Administered 2021-09-07: 10 mg via INTRAVENOUS
  Filled 2021-09-07: qty 1

## 2021-09-07 MED ORDER — SODIUM CHLORIDE (PF) 0.9 % IJ SOLN
INTRAMUSCULAR | Status: AC
  Filled 2021-09-07: qty 50

## 2021-09-07 MED ORDER — CLINDAMYCIN HCL 300 MG PO CAPS: 300.0000 mg | ORAL_CAPSULE | Freq: Four times a day (QID) | ORAL | 0 refills | Status: AC

## 2021-09-07 MED ORDER — ONDANSETRON HCL 4 MG PO TABS: 4.0000 mg | ORAL_TABLET | Freq: Three times a day (TID) | ORAL | 0 refills | Status: DC | PRN

## 2021-09-07 MED ORDER — MORPHINE SULFATE (PF) 4 MG/ML IV SOLN
4.0000 mg | Freq: Once | INTRAVENOUS | Status: AC
  Administered 2021-09-07: 4 mg via INTRAVENOUS
  Filled 2021-09-07: qty 1

## 2021-09-07 MED ORDER — IOHEXOL 300 MG/ML  SOLN
80.0000 mL | Freq: Once | INTRAMUSCULAR | Status: AC | PRN
  Administered 2021-09-07: 80 mL via INTRAVENOUS

## 2021-09-07 MED ORDER — SODIUM CHLORIDE 0.9 % IV BOLUS
1000.0000 mL | Freq: Once | INTRAVENOUS | Status: AC
  Administered 2021-09-07: 1000 mL via INTRAVENOUS

## 2021-09-07 NOTE — ED Triage Notes (Signed)
3 days of sore throat and swelling, difficulty swallowing

## 2021-09-07 NOTE — Consult Note (Signed)
Reason for Consult: Left peritonsillar abscess  HPI:  Tracey Lam is an 26 y.o. female who presents to the [facility] emergency room today complaining of progressive left-sided sore throat.  The patient has been symptomatic for 3 days.  She denies any chest pain, abdominal pain, headaches, or shortness of breath.  She has no previous history of tonsillitis.  Her evaluation revealed left tonsillar edema and erythema.  Her CT scan showed an approximately 2 cm left peritonsillar abscess.  Past Medical History:  Diagnosis Date   Asthma     History reviewed. No pertinent surgical history.  No family history on file.  Social History:  reports that she has quit smoking. She has never used smokeless tobacco. She reports current alcohol use. She reports current drug use. Drugs: Cocaine and Marijuana.  Allergies: No Known Allergies  Prior to Admission medications   Medication Sig Start Date End Date Taking? Authorizing Provider  cloNIDine (CATAPRES) 0.1 MG tablet Take 1 tablet (0.1 mg total) by mouth 2 (two) times daily in the am and at bedtime.. 1 tablet 4 times a day x 1 day then 1 tablet a day x 1 day 12/07/19   Money, Gerlene Burdock, FNP  methocarbamol (ROBAXIN) 500 MG tablet Take 1 tablet (500 mg total) by mouth 2 (two) times daily. 04/14/20   Gailen Shelter, PA     Results for orders placed or performed during the hospital encounter of 09/07/21 (from the past 48 hour(s))  CBC with Differential     Status: Abnormal   Collection Time: 09/07/21  4:34 PM  Result Value Ref Range   WBC 20.0 (H) 4.0 - 10.5 K/uL   RBC 4.05 3.87 - 5.11 MIL/uL   Hemoglobin 11.8 (L) 12.0 - 15.0 g/dL   HCT 47.8 (L) 29.5 - 62.1 %   MCV 87.2 80.0 - 100.0 fL   MCH 29.1 26.0 - 34.0 pg   MCHC 33.4 30.0 - 36.0 g/dL   RDW 30.8 65.7 - 84.6 %   Platelets 306 150 - 400 K/uL   nRBC 0.0 0.0 - 0.2 %   Neutrophils Relative % 78 %   Neutro Abs 15.6 (H) 1.7 - 7.7 K/uL   Lymphocytes Relative 11 %   Lymphs Abs 2.2 0.7 - 4.0 K/uL    Monocytes Relative 9 %   Monocytes Absolute 1.9 (H) 0.1 - 1.0 K/uL   Eosinophils Relative 1 %   Eosinophils Absolute 0.1 0.0 - 0.5 K/uL   Basophils Relative 0 %   Basophils Absolute 0.1 0.0 - 0.1 K/uL   Immature Granulocytes 1 %   Abs Immature Granulocytes 0.18 (H) 0.00 - 0.07 K/uL    Comment: Performed at [facility], [address] [place], [place] [address]  Comprehensive metabolic panel     Status: Abnormal   Collection Time: 09/07/21  4:34 PM  Result Value Ref Range   Sodium 134 (L) 135 - 145 mmol/L   Potassium 3.3 (L) 3.5 - 5.1 mmol/L   Chloride 100 98 - 111 mmol/L   CO2 24 22 - 32 mmol/L   Glucose, Bld 143 (H) 70 - 99 mg/dL    Comment: Glucose reference range applies only to samples taken after fasting for at least 8 hours.   BUN 6 6 - 20 mg/dL   Creatinine, Ser 2.84 0.44 - 1.00 mg/dL   Calcium 8.9 8.9 - 13.2 mg/dL   Total Protein 8.3 (H) 6.5 - 8.1 g/dL   Albumin 3.7 3.5 - 5.0  g/dL   AST 16 15 - 41 U/L   ALT 11 0 - 44 U/L   Alkaline Phosphatase 75 38 - 126 U/L   Total Bilirubin 0.6 0.3 - 1.2 mg/dL   GFR, Estimated >16>60 >10>60 mL/min    Comment: (NOTE) Calculated using the CKD-EPI Creatinine Equation (2021)    Anion gap 10 5 - 15    Comment: Performed at [facility], [address] [place], [place] [address]  Lactic acid, plasma     Status: None   Collection Time: 09/07/21  4:34 PM  Result Value Ref Range   Lactic Acid, Venous 1.1 0.5 - 1.9 mmol/L    Comment: Performed at [facility], [address] [place], [place] [address]  Lipase, blood     Status: None   Collection Time: 09/07/21  4:34 PM  Result Value Ref Range   Lipase 24 11 - 51 U/L    Comment: Performed at [facility], [address] [place], [place] [address]  SARS Coronavirus 2 by RT PCR (hospital order, performed in [facility] hospital lab) *cepheid single result test* Anterior Nasal Swab     Status: None   Collection  Time: 09/07/21  4:34 PM   Specimen: Anterior Nasal Swab  Result Value Ref Range   SARS Coronavirus 2 by RT PCR NEGATIVE NEGATIVE    Comment: (NOTE) SARS-CoV-2 target nucleic acids are NOT DETECTED.  The SARS-CoV-2 RNA is generally detectable in upper and lower respiratory specimens during the acute phase of infection. The lowest concentration of SARS-CoV-2 viral copies this assay can detect is 250 copies / mL. A negative result does not preclude SARS-CoV-2 infection and should not be used as the sole basis for treatment or other patient management decisions.  A negative result may occur with improper specimen collection / handling, submission of specimen other than nasopharyngeal swab, presence of viral mutation(s) within the areas targeted by this assay, and inadequate number of viral copies (<250 copies / mL). A negative result must be combined with clinical observations, patient history, and epidemiological information.  Fact Sheet for Patients:   RoadLapTop.co.zahttps://www.fda.gov/media/158405/download  Fact Sheet for Healthcare Providers: http://kim-miller.com/https://www.fda.gov/media/158404/download  This test is not yet approved or  cleared by the [place] FDA and has been authorized for detection and/or diagnosis of SARS-CoV-2 by FDA under an Emergency Use Authorization (EUA).  This EUA will remain in effect (meaning this test can be used) for the duration of the COVID-19 declaration under Section 564(b)(1) of the Act, 21 U.S.C. section 360bbb-3(b)(1), unless the authorization is terminated or revoked sooner.  Performed at [facility], [address] [place], [place] [address]   Mononucleosis screen     Status: None   Collection Time: 09/07/21  4:34 PM  Result Value Ref Range   Mono Screen NEGATIVE NEGATIVE    Comment: Performed at [facility], [address] [place], [place] [address]  Group A Strep by PCR     Status: None   Collection Time: 09/07/21   4:34 PM   Specimen: Sterile Swab  Result Value Ref Range   Group A Strep by PCR NOT DETECTED NOT DETECTED    Comment: Performed at [facility], [address] [place], [place] [address]  I-Stat Beta hCG blood, ED ([facility], [facility], [facility] only)     Status: None   Collection Time: 09/07/21  5:22 PM  Result Value Ref Range   I-stat hCG, quantitative <5.0 <  5 mIU/mL   Comment 3            Comment:   GEST. AGE      CONC.  (mIU/mL)   <=1 WEEK        5 - 50     2 WEEKS       50 - 500     3 WEEKS       100 - 10,000     4 WEEKS     1,000 - 30,000        FEMALE AND NON-PREGNANT FEMALE:     LESS THAN 5 mIU/mL     CT Soft Tissue Neck W Contrast  Result Date: 09/07/2021 CLINICAL DATA:  chills, sore throat, trismus, spitting saliva, L sided neck pain. EXAM: CT NECK WITH CONTRAST TECHNIQUE: Multidetector CT imaging of the neck was performed using the standard protocol following the bolus administration of intravenous contrast. RADIATION DOSE REDUCTION: This exam was performed according to the departmental dose-optimization program which includes automated exposure control, adjustment of the mA and/or kV according to patient size and/or use of iterative reconstruction technique. CONTRAST:  62mL OMNIPAQUE IOHEXOL 300 MG/ML  SOLN COMPARISON:  None Available. FINDINGS: Pharynx and larynx: Enlarged edematous tonsils, compatible with tonsillitis. Approximately 2.2 x 1.3 x 0.8 cm fluid collection in the region of the left tonsil, compatible with tonsillar or peritonsillar abscess. Surrounding edema involving the left oropharyngeal wall and small volume of prevertebral edema extending inferiorly to C4-C5 level. Unremarkable epiglottis and larynx. Salivary glands: No inflammation, mass, or stone. Thyroid: Normal. Lymph nodes: Enlarged cervical chain nodes bilaterally. Vascular: Limited evaluation due to non arterial timing. Major arteries are grossly patent in the neck. Limited intracranial: Negative. Visualized  orbits: Negative. Mastoids and visualized paranasal sinuses: Clear. Skeleton: No acute findings. Upper chest: Visualized lung apices are clear. IMPRESSION: 1. Findings compatible with tonsillitis with 2.2 x 1.3 x 0.8 cm left tonsillar/peritonsillar abscess. Surrounding edema including small volume of prevertebral edema. 2. Enlarged cervical chain nodes bilaterally, probably reactive given the above findings. Electronically Signed   By: Feliberto Harts M.D.   On: 09/07/2021 18:04    Blood pressure (!) 144/113, pulse (!) 102, temperature 98.3 F (36.8 C), temperature source Oral, resp. rate 20, height 4\' 10"  (1.473 m), weight 74.8 kg, SpO2 99 %. General appearance: alert and cooperative Head: Normocephalic, without obvious abnormality, atraumatic Eyes: Pupils are equal, round, reactive to light. Extraocular motion is intact.  Ears: Examination of the ears shows normal auricles and external auditory canals bilaterally.  Nose: Nasal examination shows normal mucosa, septum, turbinates.  Face: Facial examination shows no asymmetry. Palpation of the face elicit no significant tenderness.  Mouth: Oral cavity examination shows edematous and erythematous left tonsil, consistent with left peritonsillar abscess.  Mild trismus is noted. Neck: Palpation of the neck reveals no mass. The trachea is midline.  Neuro: Cranial nerves 2-12 are all grossly in tact.   Procedure: Incision and drainage of the left peritonsillar abscess.   Anesthesia: local anesthesia with 1% lidocaine with epinephrine.   Description: The patient is placed upright on the hospital bed.  After adequate local anesthesia is achieved, an 18-G spinal needle is used to make multiple passes over the left superior tonsillar pole.  Approximately 1 cc of purulent fluid was evacuated.  The abscess cavity was incised opened with the spinal needle tip.  The patient tolerated the procedure well.     Assessment/Plan: Left peritonsillar  abscess. -Incision and drainage of left peritonsillar  abscess under local anesthesia in the emergency room. -IV clindamycin 900 mg in the emergency room. -IV Decadron 20 mg -The patient may be discharged home on oral clindamycin 300 mg p.o. 4 times daily for 10 days. -The patient may follow-up in my office as needed.    Abbe Bula W Ashli Selders 09/07/2021, 7:17 PM

## 2021-09-07 NOTE — Discharge Instructions (Signed)
Your history, exam, work-up today confirmed a tonsillar/peritonsillar abscess that ENT was able to drain at the bedside.  You received the steroids and antibiotics through IV and they feel you are safe for discharge home.  Please take the antibiotics 4 times a day for the next 10 days and follow-up with them in clinic if needed.  Please use the pain medicine and nausea medicine to help with symptoms.  Please rest and stay hydrated.  If any symptoms change or worsen acutely, please return to the nearest emergency department.

## 2021-09-07 NOTE — ED Provider Notes (Signed)
[facility]-EMERGENCY DEPT Provider Note   CSN: [id number] Arrival date & time: 09/07/21  1537     History  Chief Complaint  Patient presents with   Sore Throat   Oral Swelling    Tracey Lam is a 26 y.o. female.  The history is provided by the patient and medical records. No language interpreter was used.  Sore Throat This is a new problem. The current episode started more than 2 days ago. The problem occurs constantly. The problem has been gradually worsening. Pertinent negatives include no chest pain, no abdominal pain, no headaches and no shortness of breath. Nothing aggravates the symptoms. Nothing relieves the symptoms. She has tried nothing for the symptoms. The treatment provided no relief.      Home Medications Prior to Admission medications   Medication Sig Start Date End Date Taking? Authorizing Provider  cloNIDine (CATAPRES) 0.1 MG tablet Take 1 tablet (0.1 mg total) by mouth 2 (two) times daily in the am and at bedtime.. 1 tablet 4 times a day x 1 day then 1 tablet a day x 1 day 12/07/19   Money, Gerlene Burdock, FNP  methocarbamol (ROBAXIN) 500 MG tablet Take 1 tablet (500 mg total) by mouth 2 (two) times daily. 04/14/20   Gailen Shelter, PA      Allergies    Patient has no known allergies.    Review of Systems   Review of Systems  Constitutional:  Positive for chills. Negative for diaphoresis and fatigue.  HENT:  Positive for drooling (cant swallow saliva or PO intake per pt), sore throat and trouble swallowing. Negative for congestion and sinus pain.   Respiratory:  Negative for chest tightness, shortness of breath and wheezing.   Cardiovascular:  Negative for chest pain and palpitations.  Gastrointestinal:  Positive for nausea. Negative for abdominal distention, abdominal pain, diarrhea and vomiting.  Genitourinary:  Negative for dysuria.  Musculoskeletal:  Positive for neck pain. Negative for back pain and neck stiffness.  Skin:  Negative for  rash and wound.  Neurological:  Negative for dizziness, weakness, light-headedness and headaches.  Psychiatric/Behavioral:  Negative for agitation.   All other systems reviewed and are negative.  Physical Exam Updated Vital Signs BP 110/68 (BP Location: Left Arm)   Pulse (!) 136   Temp 98.3 F (36.8 C) (Oral)   Resp 18   Ht 4\' 10"  (1.473 m)   Wt 74.8 kg   SpO2 94%   BMI 34.49 kg/m  Physical Exam Vitals and nursing note reviewed.  Constitutional:      General: She is not in acute distress.    Appearance: She is well-developed. She is ill-appearing. She is not toxic-appearing or diaphoretic.  HENT:     Head: Normocephalic and atraumatic.     Mouth/Throat:     Mouth: Mucous membranes are moist.     Pharynx: Uvula midline. Posterior oropharyngeal erythema present. No oropharyngeal exudate or uvula swelling.     Tonsils: No tonsillar exudate.  Eyes:     Conjunctiva/sclera: Conjunctivae normal.  Cardiovascular:     Rate and Rhythm: Regular rhythm. Tachycardia present.     Heart sounds: No murmur heard. Pulmonary:     Effort: Pulmonary effort is normal. No respiratory distress.     Breath sounds: Normal breath sounds. No wheezing, rhonchi or rales.  Chest:     Chest wall: No tenderness.  Abdominal:     Palpations: Abdomen is soft.     Tenderness: There is no abdominal tenderness.  Musculoskeletal:        General: No swelling.     Cervical back: Neck supple.  Skin:    General: Skin is warm and dry.     Capillary Refill: Capillary refill takes less than 2 seconds.  Neurological:     Mental Status: She is alert.  Psychiatric:        Mood and Affect: Mood normal.    ED Results / Procedures / Treatments   Labs (all labs ordered are listed, but only abnormal results are displayed) Labs Reviewed  CBC WITH DIFFERENTIAL/PLATELET - Abnormal; Notable for the following components:      Result Value   WBC 20.0 (*)    Hemoglobin 11.8 (*)    HCT 35.3 (*)    Neutro Abs 15.6 (*)     Monocytes Absolute 1.9 (*)    Abs Immature Granulocytes 0.18 (*)    All other components within normal limits  COMPREHENSIVE METABOLIC PANEL - Abnormal; Notable for the following components:   Sodium 134 (*)    Potassium 3.3 (*)    Glucose, Bld 143 (*)    Total Protein 8.3 (*)    All other components within normal limits  SARS CORONAVIRUS 2 BY RT PCR  GROUP A STREP BY PCR  CULTURE, BLOOD (ROUTINE X 2)  CULTURE, BLOOD (ROUTINE X 2)  LACTIC ACID, PLASMA  LIPASE, BLOOD  MONONUCLEOSIS SCREEN  I-STAT BETA HCG BLOOD, ED ([facility], [facility], [facility] ONLY)    EKG None  Radiology CT Soft Tissue Neck W Contrast  Result Date: 09/07/2021 CLINICAL DATA:  chills, sore throat, trismus, spitting saliva, L sided neck pain. EXAM: CT NECK WITH CONTRAST TECHNIQUE: Multidetector CT imaging of the neck was performed using the standard protocol following the bolus administration of intravenous contrast. RADIATION DOSE REDUCTION: This exam was performed according to the departmental dose-optimization program which includes automated exposure control, adjustment of the mA and/or kV according to patient size and/or use of iterative reconstruction technique. CONTRAST:  80mL OMNIPAQUE IOHEXOL 300 MG/ML  SOLN COMPARISON:  None Available. FINDINGS: Pharynx and larynx: Enlarged edematous tonsils, compatible with tonsillitis. Approximately 2.2 x 1.3 x 0.8 cm fluid collection in the region of the left tonsil, compatible with tonsillar or peritonsillar abscess. Surrounding edema involving the left oropharyngeal wall and small volume of prevertebral edema extending inferiorly to C4-C5 level. Unremarkable epiglottis and larynx. Salivary glands: No inflammation, mass, or stone. Thyroid: Normal. Lymph nodes: Enlarged cervical chain nodes bilaterally. Vascular: Limited evaluation due to non arterial timing. Major arteries are grossly patent in the neck. Limited intracranial: Negative. Visualized orbits: Negative. Mastoids and visualized  paranasal sinuses: Clear. Skeleton: No acute findings. Upper chest: Visualized lung apices are clear. IMPRESSION: 1. Findings compatible with tonsillitis with 2.2 x 1.3 x 0.8 cm left tonsillar/peritonsillar abscess. Surrounding edema including small volume of prevertebral edema. 2. Enlarged cervical chain nodes bilaterally, probably reactive given the above findings. Electronically Signed   By: Feliberto HartsFrederick S Jones M.D.   On: 09/07/2021 18:04    Procedures Procedures    Medications Ordered in ED Medications  sodium chloride (PF) 0.9 % injection (has no administration in time range)  dexamethasone (DECADRON) injection 10 mg (10 mg Intravenous Given 09/07/21 1628)  morphine (PF) 4 MG/ML injection 4 mg (4 mg Intravenous Given 09/07/21 1629)  sodium chloride 0.9 % bolus 1,000 mL (0 mLs Intravenous Stopped 09/07/21 1802)  iohexol (OMNIPAQUE) 300 MG/ML solution 80 mL (80 mLs Intravenous Contrast Given 09/07/21 1749)  dexamethasone (DECADRON) injection 10 mg (10  mg Intravenous Given 09/07/21 1906)  clindamycin (CLEOCIN) IVPB 900 mg (0 mg Intravenous Stopped 09/07/21 1937)    ED Course/ Medical Decision Making/ A&P                           Medical Decision Making Amount and/or Complexity of Data Reviewed Labs: ordered. Radiology: ordered.  Risk Prescription drug management. Decision regarding hospitalization.    Tracey Lam is a 26 y.o. female with a past medical history significant for asthma who presents with 3 days of chills, difficulty swallowing, sore throat, and difficulty opening mouth.  Patient reports that she has had no sick contacts over the last few days has had developing pain in her throat and left neck area.  She reports no history of tonsillitis or significant pharyngitis.  No history of strep or mono.  She describes the pain as severe and that she cannot open her mouth all the way.  She is describing that she cannot swallow her own saliva.  She reports she is not having difficulty  breathing currently but does say that depending on how she lays it sometimes difficult to breathe.  She denies any chest pain, abdominal pain, constipation, or diarrhea.  She does report some intermittent nausea.  Denies any other complaints.  On exam, lungs clear and chest nontender.  Abdomen nontender.  Patient does not have stridor on my exam.  Oropharyngeal exam revealed diffuse erythema but uvula was midline.  She is tender on her left submandibular area and jaw.  Does not appear to show Ludewig's angina initially on exam.  Patient is tachycardic in the 130s and appears uncomfortable.  She is warm to the touch although is afebrile here.  Clinically I do suspect she has a significant pharyngitis of some kind.  We will get a strep swab, COVID swab, and a monotest given the unilateral symptoms of swelling and pain.  We will get screening labs, give her some fluids, pain medicine, and some Decadron to help with the swelling sensation.  We will get CT soft tissue neck to look for deep space neck infection, PTA, or other abnormality.  Anticipate reassessment after work-up to determine disposition.  Given the fact that she reports she cannot swallow any foods, fluids, and is not tolerating saliva, anticipate she will likely admission for monitoring even if work-up is reassuring.  Patient's CT scan does show evidence of tonsillar/peritonsillar abscess.  She also has a leukocytosis but no lactic acidosis.  COVID, mono, and strep test negative.  She is not pregnant.  I spoke to ENT and spoke to Dr. Suszanne Conners who requested more IV Decadron, IV clindamycin which I am having pharmacy help me order as it is on restriction, and requested the ENT cart which we brought to the bedside.  Anticipate management at the bedside and he will help determine if she needs admission.  7:09 PM ENT came to the bedside and did a bedside drainage.  Patient started feel better.  He recommended the Decadron, IV clindamycin, and then he  wants her discharged with 300 mg of clindamycin 4 times daily for 10 days and she can follow-up in clinic.  We will also give her prescription for pain medicine and nausea medicine.  We will p.o. challenge shortly and after she gets the medicines through IV, she will be safe for discharge home  She passed a p.o. challenge without difficulty.  She received the medications and would like to go home.  Final Clinical Impression(s) / ED Diagnoses Final diagnoses:  Peritonsillar abscess  Sore throat    Rx / DC Orders ED Discharge Orders          Ordered    clindamycin (CLEOCIN) 300 MG capsule  4 times daily        09/07/21 1941    oxyCODONE-acetaminophen (PERCOCET/ROXICET) 5-325 MG tablet  Every 4 hours PRN        09/07/21 1941    ondansetron (ZOFRAN) 4 MG tablet  Every 8 hours PRN        09/07/21 1941            Clinical Impression: 1. Peritonsillar abscess   2. Sore throat     Disposition: Discharge  Condition: Good  I have discussed the results, Dx and Tx plan with the pt(& family if present). He/she/they expressed understanding and agree(s) with the plan. Discharge instructions discussed at great length. Strict return precautions discussed and pt &/or family have verbalized understanding of the instructions. No further questions at time of discharge.    New Prescriptions   CLINDAMYCIN (CLEOCIN) 300 MG CAPSULE    Take 1 capsule (300 mg total) by mouth 4 (four) times daily for 10 days.   ONDANSETRON (ZOFRAN) 4 MG TABLET    Take 1 tablet (4 mg total) by mouth every 8 (eight) hours as needed for nausea or vomiting.   OXYCODONE-ACETAMINOPHEN (PERCOCET/ROXICET) 5-325 MG TABLET    Take 1 tablet by mouth every 4 (four) hours as needed for severe pain.    Follow Up: Newman Pies, MD [address] [place] [address] [phone]     Inc, [facility] [address] [place] [address] [phone]     [facility] [address] [phone] [place] [place] [address] [phone]        Charley Miske, Canary Brim, MD 09/07/21 [phone]

## 2021-09-12 LAB — CULTURE, BLOOD (ROUTINE X 2)
Culture: NO GROWTH
Culture: NO GROWTH
Special Requests: ADEQUATE

## 2021-09-23 ENCOUNTER — Ambulatory Visit (HOSPITAL_COMMUNITY)
Admission: EM | Admit: 2021-09-23 | Discharge: 2021-09-24 | Disposition: A | Discharge status: Home / Self Care | Payer: No Payment, Other | Attending: Registered Nurse | Admitting: Registered Nurse

## 2021-09-23 ENCOUNTER — Encounter (HOSPITAL_COMMUNITY): Payer: Self-pay | Admitting: Registered Nurse

## 2021-09-23 DIAGNOSIS — F1590 Other stimulant use, unspecified, uncomplicated: Secondary | ICD-10-CM | POA: Diagnosis not present

## 2021-09-23 DIAGNOSIS — F1994 Other psychoactive substance use, unspecified with psychoactive substance-induced mood disorder: Secondary | ICD-10-CM | POA: Diagnosis present

## 2021-09-23 DIAGNOSIS — Z20822 Contact with and (suspected) exposure to covid-19: Secondary | ICD-10-CM | POA: Diagnosis not present

## 2021-09-23 DIAGNOSIS — F1290 Cannabis use, unspecified, uncomplicated: Secondary | ICD-10-CM | POA: Diagnosis not present

## 2021-09-23 DIAGNOSIS — F1910 Other psychoactive substance abuse, uncomplicated: Secondary | ICD-10-CM | POA: Diagnosis present

## 2021-09-23 LAB — CBC WITH DIFFERENTIAL/PLATELET
Abs Immature Granulocytes: 0.05 10*3/uL (ref 0.00–0.07)
Basophils Absolute: 0.1 10*3/uL (ref 0.0–0.1)
Basophils Relative: 1 %
Eosinophils Absolute: 0 10*3/uL (ref 0.0–0.5)
Eosinophils Relative: 0 %
HCT: 42.9 % (ref 36.0–46.0)
Hemoglobin: 14.6 g/dL (ref 12.0–15.0)
Immature Granulocytes: 0 %
Lymphocytes Relative: 15 %
Lymphs Abs: 1.6 10*3/uL (ref 0.7–4.0)
MCH: 29.3 pg (ref 26.0–34.0)
MCHC: 34 g/dL (ref 30.0–36.0)
MCV: 86 fL (ref 80.0–100.0)
Monocytes Absolute: 1.1 10*3/uL — ABNORMAL HIGH (ref 0.1–1.0)
Monocytes Relative: 10 %
Neutro Abs: 8.4 10*3/uL — ABNORMAL HIGH (ref 1.7–7.7)
Neutrophils Relative %: 74 %
Platelets: 437 10*3/uL — ABNORMAL HIGH (ref 150–400)
RBC: 4.99 MIL/uL (ref 3.87–5.11)
RDW: 14.1 % (ref 11.5–15.5)
WBC: 11.2 10*3/uL — ABNORMAL HIGH (ref 4.0–10.5)
nRBC: 0 % (ref 0.0–0.2)

## 2021-09-23 LAB — COMPREHENSIVE METABOLIC PANEL
ALT: 12 U/L (ref 0–44)
AST: 20 U/L (ref 15–41)
Albumin: 4.9 g/dL (ref 3.5–5.0)
Alkaline Phosphatase: 86 U/L (ref 38–126)
Anion gap: 19 — ABNORMAL HIGH (ref 5–15)
BUN: 14 mg/dL (ref 6–20)
CO2: 20 mmol/L — ABNORMAL LOW (ref 22–32)
Calcium: 10.2 mg/dL (ref 8.9–10.3)
Chloride: 96 mmol/L — ABNORMAL LOW (ref 98–111)
Creatinine, Ser: 1.33 mg/dL — ABNORMAL HIGH (ref 0.44–1.00)
GFR, Estimated: 57 mL/min — ABNORMAL LOW (ref >60)
Glucose, Bld: 195 mg/dL — ABNORMAL HIGH (ref 70–99)
Potassium: 3.1 mmol/L — ABNORMAL LOW (ref 3.5–5.1)
Sodium: 135 mmol/L (ref 135–145)
Total Bilirubin: 1.1 mg/dL (ref 0.3–1.2)
Total Protein: 9.8 g/dL — ABNORMAL HIGH (ref 6.5–8.1)

## 2021-09-23 LAB — HEMOGLOBIN A1C
Hgb A1c MFr Bld: 5.7 % — ABNORMAL HIGH (ref 4.8–5.6)
Mean Plasma Glucose: 116.89 mg/dL

## 2021-09-23 LAB — LIPID PANEL
Cholesterol: 201 mg/dL — ABNORMAL HIGH (ref 0–200)
HDL: 76 mg/dL (ref >40)
LDL Cholesterol: 114 mg/dL — ABNORMAL HIGH (ref 0–99)
Total CHOL/HDL Ratio: 2.6 RATIO
Triglycerides: 55 mg/dL (ref <150)
VLDL: 11 mg/dL (ref 0–40)

## 2021-09-23 LAB — TSH: TSH: 1.119 u[IU]/mL (ref 0.350–4.500)

## 2021-09-23 LAB — RESP PANEL BY RT-PCR (FLU A&B, COVID) ARPGX2
Influenza A by PCR: NEGATIVE
Influenza B by PCR: NEGATIVE
SARS Coronavirus 2 by RT PCR: NEGATIVE

## 2021-09-23 LAB — ETHANOL: Alcohol, Ethyl (B): <10 mg/dL (ref <10)

## 2021-09-23 LAB — VALPROIC ACID LEVEL: Valproic Acid Lvl: <10 ug/mL — ABNORMAL LOW (ref 50.0–100.0)

## 2021-09-23 LAB — MAGNESIUM: Magnesium: 1.8 mg/dL (ref 1.7–2.4)

## 2021-09-23 MED ORDER — BENZTROPINE MESYLATE 1 MG PO TABS
1.0000 mg | ORAL_TABLET | Freq: Once | ORAL | Status: AC | PRN
  Administered 2021-09-23: 1 mg via ORAL
  Filled 2021-09-23: qty 1

## 2021-09-23 MED ORDER — HYDROXYZINE HCL 25 MG PO TABS
25.0000 mg | ORAL_TABLET | Freq: Three times a day (TID) | ORAL | Status: DC | PRN
  Administered 2021-09-23 – 2021-09-24 (×2): 25 mg via ORAL
  Filled 2021-09-23 (×2): qty 1

## 2021-09-23 MED ORDER — TRAZODONE HCL 100 MG PO TABS
100.0000 mg | ORAL_TABLET | Freq: Every day | ORAL | Status: DC
  Administered 2021-09-23: 100 mg via ORAL
  Filled 2021-09-23: qty 1

## 2021-09-23 MED ORDER — DIVALPROEX SODIUM 500 MG PO DR TAB
500.0000 mg | DELAYED_RELEASE_TABLET | Freq: Every day | ORAL | Status: DC
  Administered 2021-09-23: 500 mg via ORAL
  Filled 2021-09-23: qty 1

## 2021-09-23 MED ORDER — HALOPERIDOL 5 MG PO TABS
5.0000 mg | ORAL_TABLET | Freq: Once | ORAL | Status: AC | PRN
  Administered 2021-09-23: 5 mg via ORAL
  Filled 2021-09-23: qty 1

## 2021-09-23 MED ORDER — MAGNESIUM HYDROXIDE 400 MG/5ML PO SUSP: 30.0000 mL | Freq: Every day | ORAL | Status: DC | PRN

## 2021-09-23 MED ORDER — GABAPENTIN 100 MG PO CAPS
200.0000 mg | ORAL_CAPSULE | Freq: Three times a day (TID) | ORAL | Status: DC
  Administered 2021-09-23 – 2021-09-24 (×3): 200 mg via ORAL
  Filled 2021-09-23 (×3): qty 2

## 2021-09-23 MED ORDER — ACETAMINOPHEN 325 MG PO TABS
650.0000 mg | ORAL_TABLET | Freq: Four times a day (QID) | ORAL | Status: DC | PRN
  Administered 2021-09-23: 650 mg via ORAL
  Filled 2021-09-23: qty 2

## 2021-09-23 MED ORDER — ALUM & MAG HYDROXIDE-SIMETH 200-200-20 MG/5ML PO SUSP
30.0000 mL | ORAL | Status: DC | PRN
Start: 2021-09-23 — End: 2021-09-24

## 2021-09-23 MED ORDER — OLANZAPINE 10 MG PO TBDP
10.0000 mg | ORAL_TABLET | Freq: Every day | ORAL | Status: DC
  Administered 2021-09-23: 10 mg via ORAL
  Filled 2021-09-23: qty 1

## 2021-09-23 NOTE — ED Notes (Signed)
Pt Awake, alert & responsive, no distress noted. Requesting night time meds, comfort measures given.  Pending IVC.  Monitoring for safety.

## 2021-09-23 NOTE — ED Notes (Signed)
[employer] called to serve IVC papers 

## 2021-09-23 NOTE — ED Provider Notes (Signed)
Patient petitioned for IVC by day provider. Follow up with magistrate indicated that papers were not received. Re-faxed petition and first exam. Custody order returned. Nursing staff notified. Copies placed in chart and wall hanger.
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
## 2021-09-23 NOTE — ED Provider Notes (Signed)
[facility] Continuous [facility] H&P  Date: 09/23/21 Patient Name: Tracey Lam MRN: [id number] Chief Complaint: No chief complaint on file.     Diagnoses:  Final diagnoses:  None    HPI: [facility] in accompanied by her mother with complaints of substance use and paranoia  Brownington LionsAkeelah Lam, 26 y.o., female patient seen face to face by this provider, consulted with Dr. Earlene PlaterKatherine Lam; and chart reviewed on 09/23/21.  Patient and mother in lobby arguing patient yelling at her mother stating she has the right to consent and mother yelling at patient that her mind is fucked up and she needs to get some help.  Patient refusing to allow staff to get her vital signs or cooperate with questioning for assessment she continues to repeat that she wants to speak to her mother alone in private.  Patient eventually followed her mother to a private room but the argument restarted once they were alone.  Mother and patient separated but patient still refusing to participate.  Once patient calmed down she was willing to participate only if she was allowed to see her mother again to speak with privately.     On evaluation Hardwick Lionskeelah Luckett reports she moved back home from [place] "about a month and a half" and has been living with her mother.  Patient states while in [place] she was recently treated for substance abuse.  Stating [facility] and the last [facility] and left there just before moving back to [place] with her mother.  States in [place] she was doing meth, xanax, and marijuana "Meth is what started it all and I left that in [place].  I wanted a clean start."  States since she has been here she has started seeing some one and is currently having unprotected sex and that she relapsed again over the weekend using xanax and ecstasy.  Patient states [facility]."  Patient is again asking for her mother wanting to speak to her  before anything else is done.  Patient informed that she is wanting to get help and it would be better if it is done voluntarily.   During evaluation Tracey Lam is standing with no acute distress. She is alert/oriented x 4; mostly uncooperative, anxious, irritable, and argumentative.  She eventually agrees to let staff get her vital signs.   Her speech is pressured and volume is loud.  She maintains good eye contact.   Her thought process is coherent and relevant; but circumstantial.  There is no indication that she is currently responding to internal/external stimuli or experiencing delusional thought content; but there is some paranoia.  Patient states that she doesn't want to be left alone and that she wants her mother at her side.  Patient denies suicidal/self-harm/homicidal ideation, psychosis, and paranoia.      Collateral Information:  Patients mother reports that patient moved back home 3 weeks from [place].  States while in [place] patient was diagnosed with "Bipolar schizophrenia I think and she was prescribed 5 different medicines but I don't know the names of them."  States patient has a strong history of polysubstance abuse (meth, xanax, marijuana, and codeine).  States that patient was in [facility] home and done well "She even made it to [facility] and one weekend she didn't come back she was found [facility] begging people for help.  She went back to the hospital after  that and then to the [facility]."  States that patient has been doing well up until this past weekend "That's when I noticed the behavior change.  She started acting paranoid thinking somebody was watching her, trying to kill her or put her in sex trafficking.  When I went in her room this morning she had pulled the carpet off the floor and the covers off the couch and bed, the drapes in room drawn closed and she was saying that somebody was after her.  When her father tried to get in the house this morning he  couldn't because she had a chair propped up under the doorknob and he couldn't get in."  Mother also states when she was bring patient to the urgent care "She tried to slam on breaks while I was driving saying that the people in the truck behind [place] was after her.  I had trouble getting her out of the car once we got here.  She needs help."  Mother called in with list of medications that she found bottles for Zyprexa 10 mg Q hs, Trazodone 100 mg Q hs, and Depakote 500 mg Q hs.  Patient doesn't have psychiatric services in [place].      PHQ 2-9:  Flowsheet Row ED from 09/23/2021 in [facility]  Thoughts that you would be better off dead, or of hurting yourself in some way Not at all  PHQ-9 Total Score 4       Flowsheet Row ED from 09/23/2021 in [facility] ED from [facility]/07/2021 in [facility]  C-SSRS RISK CATEGORY No Risk No Risk        Total Time spent with patient: 45 minutes  Musculoskeletal  Strength & Muscle Tone: within normal limits Gait & Station: normal Patient leans: N/A  Psychiatric Specialty Exam  Presentation General Appearance: Appropriate for Environment  Eye Contact:Good  Speech:Clear and Coherent; Normal Rate  Speech Volume:Normal  Handedness:No data recorded  Mood and Affect  Mood:Anxious; Irritable; Labile  Affect:Labile   Thought Process  Thought Processes:Coherent; Goal Directed; Linear  Descriptions of Associations:Circumstantial  Orientation:Full (Time, Place and Person)  Thought Content:Logical; Paranoid Ideation; Rumination  Diagnosis of Schizophrenia or Schizoaffective disorder in past: No   Hallucinations:Hallucinations: None  Ideas of Reference:Paranoia  Suicidal Thoughts:Suicidal Thoughts: No  Homicidal Thoughts:Homicidal Thoughts: No   Sensorium  Memory:Immediate Fair; Recent Fair  Judgment:Fair  Insight:Fair; Present   Executive  Functions  Concentration:Fair  Attention Span:Fair  Recall:Fair  Fund of Knowledge:Fair  Language:Good   Psychomotor Activity  Psychomotor Activity:Psychomotor Activity: Restlessness   Assets  Assets:Communication Skills; Desire for Improvement; Housing; Leisure Time; Social Support   Sleep  Sleep:Sleep: Fair   Nutritional Assessment (For OBS and [facility] admissions only) Has the patient had a weight loss or gain of 10 pounds or more in the last 3 months?: No Has the patient had a decrease in food intake/or appetite?: No Does the patient have dental problems?: No Does the patient have eating habits or behaviors that may be indicators of an eating disorder including binging or inducing vomiting?: No Has the patient recently lost weight without trying?: 0 Has the patient been eating poorly because of a decreased appetite?: 0 Malnutrition Screening Tool Score: 0    Physical Exam Vitals and nursing note reviewed. Exam conducted with a chaperone present.  Constitutional:      General: She is not in acute distress.    Appearance: Normal appearance. She is  not ill-appearing.  Cardiovascular:     Rate and Rhythm: Normal rate.  Pulmonary:     Effort: Pulmonary effort is normal.  Musculoskeletal:        General: Normal range of motion.     Cervical back: Normal range of motion.  Skin:    General: Skin is warm and dry.  Neurological:     Mental Status: She is alert and oriented to person, place, and time.  Psychiatric:        Mood and Affect: Mood is anxious. Affect is angry.        Speech: Speech is rapid and pressured.        Behavior: Behavior is agitated.        Thought Content: Thought content is paranoid. Thought content does not include homicidal or suicidal ideation.        Judgment: Judgment is impulsive.    Review of Systems  Constitutional: Negative.        Patient stating that she is having unprotected sex and thinks she may be pregnant.  Also wants to be  tested for STDs.  HENT: Negative.    Eyes: Negative.   Respiratory: Negative.    Cardiovascular: Negative.   Gastrointestinal: Negative.   Genitourinary: Negative.   Musculoskeletal: Negative.   Skin: Negative.   Neurological: Negative.   Endo/Heme/Allergies: Negative.   Psychiatric/Behavioral:  Positive for depression. Hallucinations: Denies. Substance abuse: States that she used xanax and ecstasy  a couple of days ago. Suicidal ideas: Denies.    Blood pressure (!) 157/97, pulse (!) 157, temperature 98.6 F (37 C), temperature source Oral, resp. rate 18, SpO2 98 %. There is no height or weight on file to calculate BMI.  Past Psychiatric History: Polysubstance abuse, substance induced mood disorder   Is the patient at risk to self?  She denies Has the patient been a risk to self in the past 6 months? No .    Has the patient been a risk to self within the distant past? No   Is the patient a risk to others? No   Has the patient been a risk to others in the past 6 months? No   Has the patient been a risk to others within the distant past? No   Past Medical History:  Past Medical History:  Diagnosis Date   Asthma    History reviewed. No pertinent surgical history.  Family History: History reviewed. No pertinent family history.  Social History:  Social History   Socioeconomic History   Marital status: Single    Spouse name: Not on file   Number of children: Not on file   Years of education: Not on file   Highest education level: Not on file  Occupational History   Not on file  Tobacco Use   Smoking status: Former   Smokeless tobacco: Never  Substance and Sexual Activity   Alcohol use: Yes    Comment: Occassional    Drug use: Yes    Types: Cocaine, Marijuana    Comment: pt reports she used last night    Sexual activity: Yes  Other Topics Concern   Not on file  Social History Narrative   Not on file   Social Determinants of Health   Financial Resource Strain:  Not on file  Food Insecurity: Not on file  Transportation Needs: Not on file  Physical Activity: Not on file  Stress: Not on file  Social Connections: Not on file  Intimate Partner Violence: Not on  file    SDOH:  SDOH Screenings   Alcohol Screen: Not on file  Depression (PHQ2-9): Low Risk  (09/23/2021)   Depression (PHQ2-9)    PHQ-2 Score: 4  Financial Resource Strain: Not on file  Food Insecurity: Not on file  Housing: Not on file  Physical Activity: Not on file  Social Connections: Not on file  Stress: Not on file  Tobacco Use: Medium Risk (09/23/2021)   Patient History    Smoking Tobacco Use: Former    Smokeless Tobacco Use: Never    Passive Exposure: Not on file  Transportation Needs: Not on file    Last Labs:  Admission on 09/07/2021, Discharged on 09/07/2021  Component Date Value Ref Range Status   WBC 09/07/2021 20.0 (H)  4.0 - 10.5 K/uL Final   RBC 09/07/2021 4.05  3.87 - 5.11 MIL/uL Final   Hemoglobin 09/07/2021 11.8 (L)  12.0 - 15.0 g/dL Final   HCT 33/35/4562 35.3 (L)  36.0 - 46.0 % Final   MCV 09/07/2021 87.2  80.0 - 100.0 fL Final   MCH 09/07/2021 29.1  26.0 - 34.0 pg Final   MCHC 09/07/2021 33.4  30.0 - 36.0 g/dL Final   RDW 56/38/9373 13.2  11.5 - 15.5 % Final   Platelets 09/07/2021 306  150 - 400 K/uL Final   nRBC 09/07/2021 0.0  0.0 - 0.2 % Final   Neutrophils Relative % 09/07/2021 78  % Final   Neutro Abs 09/07/2021 15.6 (H)  1.7 - 7.7 K/uL Final   Lymphocytes Relative 09/07/2021 11  % Final   Lymphs Abs 09/07/2021 2.2  0.7 - 4.0 K/uL Final   Monocytes Relative 09/07/2021 9  % Final   Monocytes Absolute 09/07/2021 1.9 (H)  0.1 - 1.0 K/uL Final   Eosinophils Relative 09/07/2021 1  % Final   Eosinophils Absolute 09/07/2021 0.1  0.0 - 0.5 K/uL Final   Basophils Relative 09/07/2021 0  % Final   Basophils Absolute 09/07/2021 0.1  0.0 - 0.1 K/uL Final   Immature Granulocytes 09/07/2021 1  % Final   Abs Immature Granulocytes 09/07/2021 0.18 (H)  0.00 -  0.07 K/uL Final   Performed at [facility], [address], [place], [place] [address]   Sodium 09/07/2021 134 (L)  135 - 145 mmol/L Final   Potassium 09/07/2021 3.3 (L)  3.5 - 5.1 mmol/L Final   Chloride 09/07/2021 100  98 - 111 mmol/L Final   CO2 09/07/2021 24  22 - 32 mmol/L Final   Glucose, Bld 09/07/2021 143 (H)  70 - 99 mg/dL Final   Glucose reference range applies only to samples taken after fasting for at least 8 hours.   BUN 09/07/2021 6  6 - 20 mg/dL Final   Creatinine, Ser 09/07/2021 0.62  0.44 - 1.00 mg/dL Final   Calcium 81/15/7262 8.9  8.9 - 10.3 mg/dL Final   Total Protein 03/55/9741 8.3 (H)  6.5 - 8.1 g/dL Final   Albumin 63/84/5364 3.7  3.5 - 5.0 g/dL Final   AST 68/06/2120 16  15 - 41 U/L Final   ALT 09/07/2021 11  0 - 44 U/L Final   Alkaline Phosphatase 09/07/2021 75  38 - 126 U/L Final   Total Bilirubin 09/07/2021 0.6  0.3 - 1.2 mg/dL Final   GFR, Estimated 09/07/2021 >60  >60 mL/min Final   Comment: (NOTE) Calculated using the CKD-EPI Creatinine Equation (2021)    Anion gap 09/07/2021 10  5 - 15 Final   Performed  at [facility], [address] [place], [place] [address]   Lactic Acid, Venous 09/07/2021 1.1  0.5 - 1.9 mmol/L Final   Performed at [facility], [address], [place], [place] [address]   Lipase 09/07/2021 24  11 - 51 U/L Final   Performed at [facility], [address], [place], [place] [address]   I-stat hCG, quantitative 09/07/2021 <5.0  <5 mIU/mL Final   Comment 3 09/07/2021          Final   Comment:   GEST. AGE      CONC.  (mIU/mL)   <=1 WEEK        5 - 50     2 WEEKS       50 - 500     3 WEEKS       100 - 10,000     4 WEEKS     1,000 - 30,000        FEMALE AND NON-PREGNANT FEMALE:     LESS THAN 5 mIU/mL    Specimen Description 09/07/2021 BLOOD LEFT HAND   Final   Special Requests 09/07/2021 BOTTLES DRAWN AEROBIC AND ANAEROBIC Blood Culture adequate volume    Final   Culture 09/07/2021    Final                   Value:NO GROWTH 5 DAYS Performed at [facility], [address] [place], [place] [address]    Report Status 09/07/2021 09/12/2021 FINAL   Final   Specimen Description 09/07/2021    Final                   Value:BLOOD LEFT ANTECUBITAL Performed at [facility], [address] [place], [place] [address]    Special Requests 09/07/2021    Final                   Value:NONE Performed at [facility], [address], [place], [place] [address]    Culture 09/07/2021    Final                   Value:NO GROWTH 5 DAYS Performed at [facility], [address], [place], [place] [address]    Report Status 09/07/2021 09/12/2021 FINAL   Final   SARS Coronavirus 2 by RT PCR 09/07/2021 NEGATIVE  NEGATIVE Final   Comment: (NOTE) SARS-CoV-2 target nucleic acids are NOT DETECTED.  The SARS-CoV-2 RNA is generally detectable in upper and lower respiratory specimens during the acute phase of infection. The lowest concentration of SARS-CoV-2 viral copies this assay can detect is 250 copies / mL. A negative result does not preclude SARS-CoV-2 infection and should not be used as the sole basis for treatment or other patient management decisions.  A negative result may occur with improper specimen collection / handling, submission of specimen other than nasopharyngeal swab, presence of viral mutation(s) within the areas targeted by this assay, and inadequate number of viral copies (<250 copies / mL). A negative result must be combined with clinical observations, patient history, and epidemiological information.  Fact Sheet for Patients:   RoadLapTop.co.za  Fact Sheet for Healthcare Providers: http://kim-miller.com/  This test is not yet approved or                           cleared by the [place] and  has been authorized for detection and/or diagnosis of  SARS-CoV-2 by FDA under an Emergency Use Authorization (EUA).  This EUA will remain in effect (meaning this test can be used) for the duration of the COVID-19 declaration under Section 564(b)(1) of the Act, 21 U.S.C. section 360bbb-3(b)(1), unless the authorization is terminated or revoked sooner.  Performed at [facility], [address] [place], [place] [address]    Mono Screen 09/07/2021 NEGATIVE  NEGATIVE Final   Performed at [facility], [address] [place], [place] [address]   Group A Strep by PCR 09/07/2021 NOT DETECTED  NOT DETECTED Final   Performed at [facility], [address], [place], [place] [address]    Allergies: Patient has no known allergies.  PTA Medications: (Not in a hospital admission)   Medical Decision Making  Tienna Bienkowski was admitted to [facility] continuous assessment unit for Substance induced mood disorder [facility]), crisis management, and stabilization. Routine labs ordered, which include Lab Orders         Resp Panel by RT-PCR (Flu A&B, Covid) Anterior Nasal Swab         CBC with Differential/Platelet         Comprehensive metabolic panel         Hemoglobin A1c         Magnesium         Ethanol         Lipid panel         TSH         RPR         Urinalysis, Routine w reflex microscopic Urine, Clean Catch         Pregnancy, urine         Valproic acid level         POCT Urine Drug Screen - (I-Screen)    Medication Management: Scheduled Medications  divalproex  500 mg Oral QHS   gabapentin  200 mg Oral TID   OLANZapine zydis  10 mg Oral QHS   traZODone  100 mg Oral QHS    Meds ordered this encounter  Medications   acetaminophen (TYLENOL) tablet 650 mg   alum & mag hydroxide-simeth (MAALOX/MYLANTA) 200-200-20 MG/5ML suspension 30 mL   magnesium hydroxide (MILK OF MAGNESIA) suspension 30 mL   OLANZapine zydis (ZYPREXA) disintegrating tablet 10 mg    traZODone (DESYREL) tablet 100 mg   divalproex (DEPAKOTE) DR tablet 500 mg   hydrOXYzine (ATARAX) tablet 25 mg   AND Linked Order Group    haloperidol (HALDOL) tablet 5 mg    benztropine (COGENTIN) tablet 1 mg   gabapentin (NEURONTIN) capsule 200 mg    Will maintain continuous observation for safety. Social work will work with patient in finding a treatment program/rehab services for substance use.  Patient will also need outpatient psychiatric services for medication management.     Recommendations  Based on my evaluation the patient does not appear to have an emergency medical condition.  Geana Walts, NP 09/23/21  5:37 PM

## 2021-09-23 NOTE — Discharge Instructions (Addendum)
Based on your presenting issues and stated needs, outpatient services for psychiatry, therapy, and intensive outpatient programs have been recommended to meet your mental health/substance use needs.  It is imperative that you immediately set up an appointment 5-7 days from the day of discharge to prevent another crisis or relapse.  In case of an urgent emergency, you have the option of contacting the Mobile Crisis Unit with [facility] at [phone].      Substance Abuse Treatment Programs  Intensive Outpatient Programs     [facility] [address] [place], [place]  [place] [address] [place], [place], [address] [phone] office       Residential Treatment Programs  [place]   [address] [place], [place] [address]     [phone]      Admissions: 8am-3pm M-F  [facility] ([place]   Temporarily closed - may re-open again in July 2023.         [address]                                         [place], [place] or [phone]      [facility] Services (RTS) [place], [place]  [facility] Program: Residential Program [phone] Days)   [place], [place] or [phone]         [facility] [address], [place], [place] [address] [phone]       [facility] [address] [place], [place]    Outpatient Psychiatry and Counseling (SA/MH)    [facility] [place], [place], [address] [phone] office    [facility] [place] sliding scale fee and walk in schedule: M-F 8am-12pm/1pm-3pm [place], [place] [address] [phone]  [facility] (2nd Floor)  (Medicaid and [place]) [place], [place], [address] [phone] phone Take the elevator located across from the front doors to the second floor.  [facility] (private insurance only) [address] [place], [place], [address] [phone] office  [facility] [address] [place], [place] [address] [phone]  [facility] ([facility] [employer])- new patient walk-in appointments available Monday - Friday 8am -3pm.          [address] [place], [place] [address] [phone] or crisis line- [facility]/ Intensive Outpatient Therapy Program [place], [place] [address] [place]      [phone] [address] [place], [place] [address]                 [place]   [facility] [phone]. [place]  [address] [place], [place] [address]   [employer] of Care          [address] [place]  [place], [place] [address]       [phone]  [place], [place], [place] [address] [phone]  [facility]    [address] [place], [place] [address]     [place], [place] Joy[address]elyn Man     [place] [address]     [phone]       [facility] [place] [address]  [facility]     [address] [place], [place], [place], [place] [address] [place]     [address]     [place], [place] [address]     [phone]       [facility] [address] [place], [place] [address] [phone] Resources for Temporary Residential  Assistance/Crisis [facility]) M-F 8am-3pm   [address] [place], [place] [address]   [phone] Services include: laundry, barbering, support groups, case management, phone  & computer access, showers, AA/NA mtgs, mental health/substance abuse nurse, job skills class, disability information, VA assistance, spiritual classes, etc.   [facility]   [address] [place]     [place] (women and children)       [place]. [place], [place] [address] [phone] [email or web address] for application and process Application Required  [place]. [place], [place] [address] [id number] application appt.) Application Required  [facility] (women only)    [address]     [place], [place] [address]     [phone]      Intake starts 6pm daily Need valid ID, SSC, & Police report  [facility] [address] [place], [place] [id number] Application Required     [facility]      [place] [address]      [place], [place] [address]     [phone]             [facility] [address] [place], [place] 90 day commitment/SA/Application process           [facility] [address] [place],  [address] [phone] Men/Women/Women and Children must be there by 7 pm  [place], [place]

## 2021-09-24 LAB — URINALYSIS, ROUTINE W REFLEX MICROSCOPIC
Bilirubin Urine: NEGATIVE
Glucose, UA: NEGATIVE mg/dL
Hgb urine dipstick: NEGATIVE
Ketones, ur: NEGATIVE mg/dL
Leukocytes,Ua: NEGATIVE
Nitrite: NEGATIVE
Protein, ur: 100 mg/dL — AB
Specific Gravity, Urine: >1.03 — ABNORMAL HIGH (ref 1.005–1.030)
pH: 6 (ref 5.0–8.0)

## 2021-09-24 LAB — POCT URINE DRUG SCREEN - MANUAL ENTRY (I-SCREEN)
POC Amphetamine UR: POSITIVE — AB
POC Buprenorphine (BUP): NOT DETECTED
POC Cocaine UR: NOT DETECTED
POC Marijuana UR: POSITIVE — AB
POC Methadone UR: NOT DETECTED
POC Methamphetamine UR: POSITIVE — AB
POC Morphine: NOT DETECTED
POC Oxazepam (BZO): NOT DETECTED
POC Oxycodone UR: NOT DETECTED
POC Secobarbital (BAR): NOT DETECTED

## 2021-09-24 LAB — URINALYSIS, MICROSCOPIC (REFLEX)

## 2021-09-24 LAB — PREGNANCY, URINE: Preg Test, Ur: NEGATIVE

## 2021-09-24 LAB — RPR: RPR Ser Ql: NONREACTIVE

## 2021-09-24 MED ORDER — DIVALPROEX SODIUM ER 500 MG PO TB24: 500.0000 mg | ORAL_TABLET | Freq: Every day | ORAL | 0 refills | Status: DC

## 2021-09-24 MED ORDER — OLANZAPINE 10 MG PO TABS: 10.0000 mg | ORAL_TABLET | Freq: Every day | ORAL | 0 refills | Status: DC

## 2021-09-24 MED ORDER — TRAZODONE HCL 50 MG PO TABS: 50.0000 mg | ORAL_TABLET | Freq: Every day | ORAL | 0 refills | Status: DC

## 2021-09-24 NOTE — ED Notes (Signed)
Pt sleeping at present, no distress noted.  Monitoring for safety. 

## 2021-09-24 NOTE — ED Notes (Signed)
Pt presents with anxious mood, affect congruent. [place] states she '' I am feeling much better from yesterday, thank you so much for your help yesterday, I hadn't slept in days and I was scared. I'm doing okay now. I'm just wondering what they did with my wig'' Patient reports she slept well last night and appears to be much improved from paranoia and suspiciousness/psychosis she was experiencing on admit. She denies any SI or HI or A/V Hallucinations. Pt educated that illicit substances can negatively impacting mental health and thinking. Pt is safe, in no acute distress at this time. Po fluids and meal given.

## 2021-09-24 NOTE — ED Notes (Signed)
Pt sleeping at present, no distress noted,  Monitoring for safety. 

## 2021-09-24 NOTE — ED Provider Notes (Signed)
FBC/OBS ASAP Discharge Summary  Date and Time: 09/24/2021 8:01 PM  Name: Tracey Lam  MRN:  [id number]   Discharge Diagnoses:  Final diagnoses:  Polysubstance abuse (HCC)  Substance induced mood disorder (HCC)   Subjective:   Pt assessed face to face by nurse practitioner.  Pt w/ reported hx of "paranoid schizophrenia".  Pt reports euthymic mood. She states she is "way better" today. She attributes yesterday's presentation to use of ecstasy pills 2 to 3 days ago. States she "had a bad trip". Utox from 09/24/21 positive for amphetamine, methamphetamine, marijuana. Pt reports her appetite has been good, eating 3 meals/day. Reports adequate sleep. She denies SI/VI/HI. She denies hx of NSSI. She denies AVH, paranoia. She denies knowledge of family psychiatric hx. Pt states she is living w/ her mother. Pt refuses to allow collateral w/ her mother. She states her mother will want her to go to residential treatment and she is not interested in residential tx at this time, plans on quitting on her own. Pt verbalizes readiness for discharge and requests discharge at this time. She verbally contracts to safety.   Discussed plan for discharge w/ follow up w/ outpatient medication management and counseling, substance use tx. Reviewed abnml labs w/ pt and discussed follow up with PCP. Pt agrees w/ plan.  Pt is a&ox3, in no acute distress, non-toxic appearing. Pt is disheveled. Eye contact is good. Speech is clear and coherent, w/ nml rate and volume. Reported mood is euthymic. Affect is appropriate. TP is coherent, goal directed, linear. Description of associations is intact. TC is logical. There is no evidence of responding to internal stimuli, agitation, aggression or distractibility. No delusions or paranoia elicited. Pt is calm, cooperative, pleasant.   Stay Summary:  Pt is a 26 y/o female w/ reported hx of "paranoid schizophrenia" presenting to [facility] on 09/23/21 w/ complaints of substance use and  paranoia. On reassessment, pt denies SI/VI/HI, AVH, paranoia. She presents w/ coherent, goal directed, linear TP and logical TC. No delusions or paranoia elicited. Pt is discharged.  Total Time spent with patient: 20 minutes  Past Psychiatric History: Pt w/ reported hx of "paranoid schizophrenia" Past Medical History:  Past Medical History:  Diagnosis Date   Asthma    History reviewed. No pertinent surgical history. Family History: History reviewed. No pertinent family history. Family Psychiatric History: Pt denies family psychiatric hx Social History:  Social History   Substance and Sexual Activity  Alcohol Use Yes   Comment: Occassional      Social History   Substance and Sexual Activity  Drug Use Yes   Types: Cocaine, Marijuana   Comment: pt reports she used last night     Social History   Socioeconomic History   Marital status: Single    Spouse name: Not on file   Number of children: Not on file   Years of education: Not on file   Highest education level: Not on file  Occupational History   Not on file  Tobacco Use   Smoking status: Former   Smokeless tobacco: Never  Substance and Sexual Activity   Alcohol use: Yes    Comment: Occassional    Drug use: Yes    Types: Cocaine, Marijuana    Comment: pt reports she used last night    Sexual activity: Yes  Other Topics Concern   Not on file  Social History Narrative   Not on file   Social Determinants of Health   Financial Resource Strain: Not on file  Food Insecurity: Not on file  Transportation Needs: Not on file  Physical Activity: Not on file  Stress: Not on file  Social Connections: Not on file   SDOH:  SDOH Screenings   Alcohol Screen: Not on file  Depression (PHQ2-9): Low Risk  (09/23/2021)   Depression (PHQ2-9)    PHQ-2 Score: 4  Financial Resource Strain: Not on file  Food Insecurity: Not on file  Housing: Not on file  Physical Activity: Not on file  Social Connections: Not on file   Stress: Not on file  Tobacco Use: Medium Risk (09/23/2021)   Patient History    Smoking Tobacco Use: Former    Smokeless Tobacco Use: Never    Passive Exposure: Not on file  Transportation Needs: Not on file    Tobacco Cessation:  N/A, patient does not currently use tobacco products  Current Medications:  No current facility-administered medications for this encounter.   Current Outpatient Medications  Medication Sig Dispense Refill   ondansetron (ZOFRAN) 4 MG tablet Take 1 tablet (4 mg total) by mouth every 8 (eight) hours as needed for nausea or vomiting. 12 tablet 0   oxyCODONE-acetaminophen (PERCOCET/ROXICET) 5-325 MG tablet Take 1 tablet by mouth every 4 (four) hours as needed for severe pain. 15 tablet 0   divalproex (DEPAKOTE ER) 500 MG 24 hr tablet Take 1 tablet (500 mg total) by mouth at bedtime. 30 tablet 0   OLANZapine (ZYPREXA) 10 MG tablet Take 1 tablet (10 mg total) by mouth at bedtime. 30 tablet 0   traZODone (DESYREL) 50 MG tablet Take 1 tablet (50 mg total) by mouth at bedtime. 30 tablet 0    PTA Medications: (Not in a hospital admission)      09/23/2021    4:58 PM  Depression screen PHQ 2/9  Decreased Interest 1  Down, Depressed, Hopeless 1  PHQ - 2 Score 2  Altered sleeping 0  Tired, decreased energy 0  Change in appetite 0  Feeling bad or failure about yourself  1  Trouble concentrating 1  Moving slowly or fidgety/restless 0  Suicidal thoughts 0  PHQ-9 Score 4  Difficult doing work/chores Somewhat difficult    [facility]sheet [facility] in [facility]  C-SSRS RISK CATEGORY No Risk No Risk       Musculoskeletal  Strength & Muscle Tone: within normal limits Gait & Station: normal Patient leans: N/A  Psychiatric Specialty Exam  Presentation  General Appearance: Disheveled  Eye Contact:Good  Speech:Clear and Coherent; Normal Rate  Speech  Volume:Normal  Handedness:Right   Mood and Affect  Mood:Euthymic  Affect:Appropriate   Thought Process  Thought Processes:Coherent; Goal Directed; Linear  Descriptions of Associations:Intact  Orientation:Full (Time, Place and Person)  Thought Content:Logical  Diagnosis of Schizophrenia or Schizoaffective disorder in past: Yes    Hallucinations:Hallucinations: None  Ideas of Reference:None  Suicidal Thoughts:Suicidal Thoughts: No  Homicidal Thoughts:Homicidal Thoughts: No   Sensorium  Memory:Immediate Good; Recent Fair  Judgment:Fair  Insight:Fair   Executive Functions  Concentration:Fair  Attention Span:Fair  Recall:Fair  Fund of Knowledge:Fair  Language:Good   Psychomotor Activity  Psychomotor Activity:Psychomotor Activity: Normal   Assets  Assets:Communication Skills; Desire for Improvement; Financial Resources/Insurance; Housing; Social Support   Sleep  Sleep:Sleep: Fair   Nutritional Assessment (For OBS and FBC admissions only) Has the patient had a weight loss or gain of 10 pounds or more in the last 3 months?: No Has the patient  had a decrease in food intake/or appetite?: No Does the patient have dental problems?: No Does the patient have eating habits or behaviors that may be indicators of an eating disorder including binging or inducing vomiting?: No Has the patient recently lost weight without trying?: 0 Has the patient been eating poorly because of a decreased appetite?: 0 Malnutrition Screening Tool Score: 0    Physical Exam  Physical Exam Pulmonary:     Effort: Pulmonary effort is normal.  Neurological:     Mental Status: She is alert and oriented to person, place, and time.  Psychiatric:        Attention and Perception: Attention and perception normal.        Mood and Affect: Mood and affect normal.        Speech: Speech normal.        Behavior: Behavior normal. Behavior is cooperative.        Thought Content: Thought  content normal.    Review of Systems  Constitutional:  Negative for chills and fever.  Respiratory:  Negative for shortness of breath.   Cardiovascular:  Negative for chest pain and palpitations.  Psychiatric/Behavioral:  Positive for substance abuse.    Blood pressure 118/76, pulse (!) 128, temperature 98.5 F (36.9 C), temperature source Oral, resp. rate 20, SpO2 100 %. There is no height or weight on file to calculate BMI.  Demographic Factors:  Adolescent or young adult and Unemployed  Loss Factors: NA  Historical Factors: Prior suicide attempts  Risk Reduction Factors:   Living with another person, especially a relative  Continued Clinical Symptoms:  Previous Psychiatric Diagnoses and Treatments  Cognitive Features That Contribute To Risk:  None    Suicide Risk:  Minimal: No identifiable suicidal ideation.  Patients presenting with no risk factors but with morbid ruminations; may be classified as minimal risk based on the severity of the depressive symptoms  Plan Of Care/Follow-up recommendations:  Follow up with outpatient medication management and counseling Follow up with substance use treatment Follow up with your PCP  Disposition:  Discharge  Lauree Chandler, NP 09/24/2021, 8:01 PM

## 2021-09-24 NOTE — BH Assessment (Signed)
LCSW Progress Note  Per Darrick Grinder, NP, this pt does not require psychiatric hospitalization at this time.  Pt is psychiatrically cleared.  Discharge instructions include several resources for psychiatry and OPT therapy/substance use counseling, IOP, SA residential treatments, and shelters in case she loses housing.  Darrick Grinder, NP, has been notified.  Hansel Starling, MSW, LCSW [facility] [phone] or [phone]

## 2021-09-24 NOTE — ED Notes (Signed)
Patient told me she threw her wig in the trash last night because she mistook it for a bag that had her belonging in it. Now she is wondering if we could find it.

## 2021-09-24 NOTE — Progress Notes (Signed)
AVS printed for patient and RX given. Reviewed outpatient S/A as well as residential tx substance abuse options with patient as documented on AVS. Pt verbalized understanding stating '' I told my mom this isn't a treatment facility, I've been to [facility] before. She didn't want to listen to me because you know people don't listen to you when you on drugs'' Discussed patients symptoms and encouraged patient to maintain sobriety. Pt states '' I know, I get to feeling bad when I stop my medications and then I start using drugs, which don't help. '' Patient denies any SI or HI or A/V Hallucinations. She verbalized understanding. All belongings returned and provided bus passes . Pt escorted from unit to lobby in no acute distress.

## 2021-09-25 LAB — GC/CHLAMYDIA PROBE AMP (~~LOC~~) NOT AT ARMC
Chlamydia: NEGATIVE
Comment: NEGATIVE
Comment: NORMAL
Neisseria Gonorrhea: NEGATIVE

## 2021-10-27 ENCOUNTER — Other Ambulatory Visit: Payer: Self-pay

## 2021-10-27 ENCOUNTER — Encounter (HOSPITAL_COMMUNITY): Payer: Self-pay | Admitting: Emergency Medicine

## 2021-10-27 ENCOUNTER — Inpatient Hospital Stay (HOSPITAL_COMMUNITY)
Admission: EM | Admit: 2021-10-27 | Discharge: 2021-10-29 | DRG: 683 | Disposition: A | Discharge status: Home / Self Care | Payer: 59 | Attending: Internal Medicine | Admitting: Internal Medicine

## 2021-10-27 DIAGNOSIS — E872 Acidosis, unspecified: Secondary | ICD-10-CM | POA: Diagnosis present

## 2021-10-27 DIAGNOSIS — Z87891 Personal history of nicotine dependence: Secondary | ICD-10-CM

## 2021-10-27 DIAGNOSIS — F200 Paranoid schizophrenia: Secondary | ICD-10-CM | POA: Diagnosis present

## 2021-10-27 DIAGNOSIS — F4310 Post-traumatic stress disorder, unspecified: Secondary | ICD-10-CM | POA: Diagnosis present

## 2021-10-27 DIAGNOSIS — F3181 Bipolar II disorder: Secondary | ICD-10-CM | POA: Diagnosis present

## 2021-10-27 DIAGNOSIS — R739 Hyperglycemia, unspecified: Secondary | ICD-10-CM | POA: Diagnosis present

## 2021-10-27 DIAGNOSIS — F1690 Hallucinogen use, unspecified, uncomplicated: Secondary | ICD-10-CM | POA: Diagnosis present

## 2021-10-27 DIAGNOSIS — D72829 Elevated white blood cell count, unspecified: Secondary | ICD-10-CM | POA: Diagnosis present

## 2021-10-27 DIAGNOSIS — E876 Hypokalemia: Secondary | ICD-10-CM | POA: Diagnosis present

## 2021-10-27 DIAGNOSIS — R45851 Suicidal ideations: Secondary | ICD-10-CM | POA: Diagnosis present

## 2021-10-27 DIAGNOSIS — Z79899 Other long term (current) drug therapy: Secondary | ICD-10-CM

## 2021-10-27 DIAGNOSIS — J45909 Unspecified asthma, uncomplicated: Secondary | ICD-10-CM | POA: Diagnosis present

## 2021-10-27 DIAGNOSIS — F23 Brief psychotic disorder: Secondary | ICD-10-CM | POA: Diagnosis present

## 2021-10-27 DIAGNOSIS — F19959 Other psychoactive substance use, unspecified with psychoactive substance-induced psychotic disorder, unspecified: Secondary | ICD-10-CM | POA: Diagnosis present

## 2021-10-27 DIAGNOSIS — N179 Acute kidney failure, unspecified: Principal | ICD-10-CM

## 2021-10-27 LAB — CBC WITH DIFFERENTIAL/PLATELET
Abs Immature Granulocytes: 0.05 10*3/uL (ref 0.00–0.07)
Basophils Absolute: 0.1 10*3/uL (ref 0.0–0.1)
Basophils Relative: 0 %
Eosinophils Absolute: 0 10*3/uL (ref 0.0–0.5)
Eosinophils Relative: 0 %
HCT: 43 % (ref 36.0–46.0)
Hemoglobin: 14.8 g/dL (ref 12.0–15.0)
Immature Granulocytes: 0 %
Lymphocytes Relative: 14 %
Lymphs Abs: 1.9 10*3/uL (ref 0.7–4.0)
MCH: 29.4 pg (ref 26.0–34.0)
MCHC: 34.4 g/dL (ref 30.0–36.0)
MCV: 85.3 fL (ref 80.0–100.0)
Monocytes Absolute: 1 10*3/uL (ref 0.1–1.0)
Monocytes Relative: 7 %
Neutro Abs: 10.9 10*3/uL — ABNORMAL HIGH (ref 1.7–7.7)
Neutrophils Relative %: 79 %
Platelets: 354 10*3/uL (ref 150–400)
RBC: 5.04 MIL/uL (ref 3.87–5.11)
RDW: 14.5 % (ref 11.5–15.5)
WBC: 13.8 10*3/uL — ABNORMAL HIGH (ref 4.0–10.5)
nRBC: 0 % (ref 0.0–0.2)

## 2021-10-27 LAB — ACETAMINOPHEN LEVEL: Acetaminophen (Tylenol), Serum: <10 ug/mL — ABNORMAL LOW (ref 10–30)

## 2021-10-27 LAB — COMPREHENSIVE METABOLIC PANEL
ALT: 12 U/L (ref 0–44)
AST: 22 U/L (ref 15–41)
Albumin: 5.3 g/dL — ABNORMAL HIGH (ref 3.5–5.0)
Alkaline Phosphatase: 90 U/L (ref 38–126)
Anion gap: 16 — ABNORMAL HIGH (ref 5–15)
BUN: 18 mg/dL (ref 6–20)
CO2: 17 mmol/L — ABNORMAL LOW (ref 22–32)
Calcium: 10 mg/dL (ref 8.9–10.3)
Chloride: 104 mmol/L (ref 98–111)
Creatinine, Ser: 1.75 mg/dL — ABNORMAL HIGH (ref 0.44–1.00)
GFR, Estimated: 41 mL/min — ABNORMAL LOW (ref >60)
Glucose, Bld: 162 mg/dL — ABNORMAL HIGH (ref 70–99)
Potassium: 2.7 mmol/L — CL (ref 3.5–5.1)
Sodium: 137 mmol/L (ref 135–145)
Total Bilirubin: 0.7 mg/dL (ref 0.3–1.2)
Total Protein: 9.8 g/dL — ABNORMAL HIGH (ref 6.5–8.1)

## 2021-10-27 LAB — I-STAT BETA HCG BLOOD, ED (MC, WL, AP ONLY): I-stat hCG, quantitative: <5 m[IU]/mL (ref <5)

## 2021-10-27 LAB — RAPID URINE DRUG SCREEN, HOSP PERFORMED
Amphetamines: POSITIVE — AB
Barbiturates: NOT DETECTED
Benzodiazepines: NOT DETECTED
Cocaine: NOT DETECTED
Opiates: NOT DETECTED
Tetrahydrocannabinol: POSITIVE — AB

## 2021-10-27 LAB — SALICYLATE LEVEL: Salicylate Lvl: <7 mg/dL — ABNORMAL LOW (ref 7.0–30.0)

## 2021-10-27 LAB — ETHANOL: Alcohol, Ethyl (B): <10 mg/dL (ref <10)

## 2021-10-27 LAB — MAGNESIUM: Magnesium: 1.8 mg/dL (ref 1.7–2.4)

## 2021-10-27 LAB — CK: Total CK: 426 U/L — ABNORMAL HIGH (ref 38–234)

## 2021-10-27 LAB — VALPROIC ACID LEVEL: Valproic Acid Lvl: <10 ug/mL — ABNORMAL LOW (ref 50.0–100.0)

## 2021-10-27 MED ORDER — POTASSIUM CHLORIDE 10 MEQ/100ML IV SOLN
10.0000 meq | Freq: Once | INTRAVENOUS | Status: AC
  Administered 2021-10-27: 10 meq via INTRAVENOUS
  Filled 2021-10-27: qty 100

## 2021-10-27 MED ORDER — LACTATED RINGERS IV SOLN: INTRAVENOUS | Status: DC

## 2021-10-27 MED ORDER — POTASSIUM CHLORIDE IN NACL 40-0.9 MEQ/L-% IV SOLN
INTRAVENOUS | Status: AC
  Filled 2021-10-27: qty 1000

## 2021-10-27 MED ORDER — ZIPRASIDONE MESYLATE 20 MG IM SOLR
INTRAMUSCULAR | Status: AC
  Administered 2021-10-27: 10 mg
  Filled 2021-10-27: qty 20

## 2021-10-27 MED ORDER — ONDANSETRON HCL 4 MG PO TABS: 4.0000 mg | ORAL_TABLET | Freq: Four times a day (QID) | ORAL | Status: DC | PRN

## 2021-10-27 MED ORDER — LACTATED RINGERS IV BOLUS
2000.0000 mL | Freq: Once | INTRAVENOUS | Status: AC
  Administered 2021-10-27: 1000 mL via INTRAVENOUS

## 2021-10-27 MED ORDER — POTASSIUM CHLORIDE CRYS ER 20 MEQ PO TBCR
40.0000 meq | EXTENDED_RELEASE_TABLET | Freq: Once | ORAL | Status: AC
  Administered 2021-10-27: 40 meq via ORAL
  Filled 2021-10-27: qty 2

## 2021-10-27 MED ORDER — ACETAMINOPHEN 650 MG RE SUPP: 650.0000 mg | Freq: Four times a day (QID) | RECTAL | Status: DC | PRN

## 2021-10-27 MED ORDER — ENOXAPARIN SODIUM 40 MG/0.4ML IJ SOSY
40.0000 mg | PREFILLED_SYRINGE | INTRAMUSCULAR | Status: DC
  Filled 2021-10-27: qty 0.4

## 2021-10-27 MED ORDER — HALOPERIDOL 5 MG PO TABS
5.0000 mg | ORAL_TABLET | Freq: Four times a day (QID) | ORAL | Status: DC | PRN
  Administered 2021-10-27: 5 mg via ORAL
  Filled 2021-10-27 (×2): qty 1

## 2021-10-27 MED ORDER — LORAZEPAM 2 MG/ML IJ SOLN
INTRAMUSCULAR | Status: AC
  Administered 2021-10-27: 2 mg
  Filled 2021-10-27: qty 1

## 2021-10-27 MED ORDER — HALOPERIDOL LACTATE 5 MG/ML IJ SOLN: 5.0000 mg | Freq: Four times a day (QID) | INTRAMUSCULAR | Status: DC | PRN

## 2021-10-27 MED ORDER — LORAZEPAM 2 MG/ML IJ SOLN
0.5000 mg | Freq: Once | INTRAMUSCULAR | Status: AC
  Administered 2021-10-28: 0.5 mg via INTRAVENOUS
  Filled 2021-10-27: qty 1

## 2021-10-27 MED ORDER — ENSURE ENLIVE PO LIQD
237.0000 mL | Freq: Two times a day (BID) | ORAL | Status: DC
  Administered 2021-10-28 – 2021-10-29 (×3): 237 mL via ORAL

## 2021-10-27 MED ORDER — LORAZEPAM 1 MG PO TABS
1.0000 mg | ORAL_TABLET | ORAL | Status: AC | PRN
  Administered 2021-10-27 – 2021-10-28 (×3): 1 mg via ORAL
  Filled 2021-10-27 (×3): qty 1

## 2021-10-27 MED ORDER — ONDANSETRON HCL 4 MG/2ML IJ SOLN: 4.0000 mg | Freq: Four times a day (QID) | INTRAMUSCULAR | Status: DC | PRN

## 2021-10-27 MED ORDER — ACETAMINOPHEN 325 MG PO TABS: 650.0000 mg | ORAL_TABLET | Freq: Four times a day (QID) | ORAL | Status: DC | PRN

## 2021-10-27 MED ORDER — STERILE WATER FOR INJECTION IJ SOLN
INTRAMUSCULAR | Status: AC
  Administered 2021-10-27: 2.1 mL
  Filled 2021-10-27: qty 10

## 2021-10-27 NOTE — ED Notes (Signed)
Pt has not urinated

## 2021-10-27 NOTE — Progress Notes (Signed)
Pt verbalizes suicidal ideations. Dr. Robb Matar and Delford Field, RN made aware. Room setup per protocol.

## 2021-10-27 NOTE — ED Notes (Signed)
Patient transported to bed. All belongings taken with her as well as IVC paperwork

## 2021-10-27 NOTE — ED Notes (Addendum)
Patient belongings placed in cabinet 2. Patient still sleepy from medications administered in triage. Calm and cooperative at this time. Sitter not assigned for patient at this time.

## 2021-10-27 NOTE — ED Notes (Signed)
Patient awake, asking to use the restroom.

## 2021-10-27 NOTE — ED Triage Notes (Signed)
Pt arrives w/ [employer]. Per [employer], pt IVC by parent. Pt very paranoid and hallucinating upon arrival.

## 2021-10-27 NOTE — ED Notes (Addendum)
According to charge, the [facility] is looking for a sitter but there is none available at this time.

## 2021-10-27 NOTE — Progress Notes (Signed)
   10/27/21 1816  Assess: MEWS Score  Temp 97.8 F (36.6 C)  BP 113/83  MAP (mmHg) 92  Pulse Rate (!) 111  Resp 18  SpO2 100 %  O2 Device Room Air  Assess: MEWS Score  MEWS Temp 0  MEWS Systolic 0  MEWS Pulse 2  MEWS RR 0  MEWS LOC 0  MEWS Score 2  MEWS Score Color Yellow  Assess: if the MEWS score is Yellow or Red  Were vital signs taken at a resting state? Yes  Focused Assessment Change from prior assessment (see assessment flowsheet)  Does the patient meet 2 or more of the SIRS criteria? No  MEWS guidelines implemented *See Row Information* Yes  Take Vital Signs  Increase Vital Sign Frequency  Yellow: Q 2hr X 2 then Q 4hr X 2, if remains yellow, continue Q 4hrs  Escalate  MEWS: Escalate Yellow: discuss with charge nurse/RN and consider discussing with provider and RRT  Notify: Charge Nurse/RN  Name of Charge Nurse/RN Notified Delford Field, RN  Date Charge Nurse/RN Notified 10/27/21  Time Charge Nurse/RN Notified 1854  Assess: SIRS CRITERIA  SIRS Temperature  0  SIRS Pulse 1  SIRS Respirations  0  SIRS WBC 0  SIRS Score Sum  1

## 2021-10-27 NOTE — ED Notes (Signed)
Patient pulled IV out while in restroom

## 2021-10-27 NOTE — ED Provider Notes (Signed)
[facility]-EM[facility] DE[facility] Provider Note   CSN: [id number] Arrival date & time: 10/27/21  9163     History  No chief complaint on file.   Tracey Lam is a 26 y.o. female.  26 year old female with history of bipolar disorder and schizophrenia and depression with recent hospitalizations for substance abuse presents with severe paranoia and agitation.  Patient did use hallucinogenic's recently.  Patient would not focus enough to give me a complete history.  This history is from the petition from [employer] which accompanied the patient.  When I speak with her, she is very paranoid and does not want to talk to me.       Home Medications Prior to Admission medications   Medication Sig Start Date End Date Taking? Authorizing Provider  divalproex (DEPAKOTE ER) 500 MG 24 hr tablet Take 1 tablet (500 mg total) by mouth at bedtime. 09/24/21 10/24/21  Lauree Chandler, NP  OLANZapine (ZYPREXA) 10 MG tablet Take 1 tablet (10 mg total) by mouth at bedtime. 09/24/21 10/24/21  Lauree Chandler, NP  ondansetron (ZOFRAN) 4 MG tablet Take 1 tablet (4 mg total) by mouth every 8 (eight) hours as needed for nausea or vomiting. 09/07/21   Tegeler, Canary Brim, MD  oxyCODONE-acetaminophen (PERCOCET/ROXICET) 5-325 MG tablet Take 1 tablet by mouth every 4 (four) hours as needed for severe pain. 09/07/21   Tegeler, Canary Brim, MD  traZODone (DESYREL) 50 MG tablet Take 1 tablet (50 mg total) by mouth at bedtime. 09/24/21 10/24/21  Lauree Chandler, NP      Allergies    Patient has no known allergies.    Review of Systems   Review of Systems  Unable to perform ROS: Psychiatric disorder    Physical Exam Updated Vital Signs There were no vitals taken for this visit. Physical Exam Vitals and nursing note reviewed.  Constitutional:      General: She is not in acute distress.    Appearance: Normal appearance. She is well-developed. She is not toxic-appearing.  HENT:      Head: Normocephalic and atraumatic.  Eyes:     General: Lids are normal.     Conjunctiva/sclera: Conjunctivae normal.     Pupils: Pupils are equal, round, and reactive to light.  Neck:     Thyroid: No thyroid mass.     Trachea: No tracheal deviation.  Cardiovascular:     Rate and Rhythm: Normal rate and regular rhythm.     Heart sounds: Normal heart sounds. No murmur heard.    No gallop.  Pulmonary:     Effort: Pulmonary effort is normal. No respiratory distress.     Breath sounds: Normal breath sounds. No stridor. No decreased breath sounds, wheezing, rhonchi or rales.  Abdominal:     General: There is no distension.     Palpations: Abdomen is soft.     Tenderness: There is no abdominal tenderness. There is no rebound.  Musculoskeletal:        General: No tenderness. Normal range of motion.     Cervical back: Normal range of motion and neck supple.  Skin:    General: Skin is warm and dry.     Findings: No abrasion or rash.  Neurological:     Mental Status: She is alert and oriented to person, place, and time. Mental status is at baseline.     GCS: GCS eye subscore is 4. GCS verbal subscore is 5. GCS motor subscore is 6.  Cranial Nerves: No cranial nerve deficit.     Sensory: No sensory deficit.     Motor: Motor function is intact.  Psychiatric:        Attention and Perception: She is inattentive.        Mood and Affect: Mood is elated. Affect is labile and angry.        Speech: Speech is rapid and pressured.        Behavior: Behavior is agitated, aggressive, hyperactive and combative.     ED Results / Procedures / Treatments   Labs (all labs ordered are listed, but only abnormal results are displayed) Labs Reviewed  CBC WITH DIFFERENTIAL/PLATELET  COMPREHENSIVE METABOLIC PANEL  RAPID URINE DRUG SCREEN, HOSP PERFORMED  ETHANOL  ACETAMINOPHEN LEVEL  SALICYLATE LEVEL  I-STAT BETA HCG BLOOD, ED ([facility], [facility], [facility] ONLY)    EKG None  Radiology No results  found.  Procedures Procedures    Medications Ordered in ED Medications  ziprasidone (GEODON) 20 MG injection (has no administration in time range)  sterile water (preservative free) injection (has no administration in time range)  LORazepam (ATIVAN) 2 MG/ML injection (has no administration in time range)    ED Course/ Medical Decision Making/ A&P                           Medical Decision Making Amount and/or Complexity of Data Reviewed Labs: ordered.  Risk Prescription drug management.   Patient very agitated and aggressive here.  Attempted multiple times with verbal de-escalation unsuccessfully.  Patient did initially require chemical restraint with Ativan and Geodon.  Electrolytes were significant for acute kidney injury as well as hypokalemia.  Considered that patient could have rhabdomyolysis but her CK was only above 400.  She is resting currently at this time.  Initiated potassium replacement.  Patient also given IV fluids for suspected dehydration.  Magnesium is within normal limits.  Patient will require admission for observation  CRITICAL CARE Performed by: Toy Baker Total critical care time: 60 minutes Critical care time was exclusive of separately billable procedures and treating other patients. Critical care was necessary to treat or prevent imminent or life-threatening deterioration. Critical care was time spent personally by me on the following activities: development of treatment plan with patient and/or surrogate as well as nursing, discussions with consultants, evaluation of patient's response to treatment, examination of patient, obtaining history from patient or surrogate, ordering and performing treatments and interventions, ordering and review of laboratory studies, ordering and review of radiographic studies, pulse oximetry and re-evaluation of patient's condition.         Final Clinical Impression(s) / ED Diagnoses Final diagnoses:  None     Rx / DC Orders ED Discharge Orders     None         Lorre Nick, MD 10/27/21 1344

## 2021-10-27 NOTE — H&P (Signed)
History and Physical    Patient: Tracey Lam MPN:[id number] DOB: March 22, 1996 DOA: 10/27/2021 DOS: the patient was seen and examined on 10/27/2021 PCP: Patient, No Pcp Per  Patient coming from: Home  Chief Complaint:  Chief Complaint  Patient presents with   IVC   HPI: Tracey Lam is a 26 y.o. female with medical history significant of of asthma, bipolar disorder, schizophrenia and depression with multiple hospitalizations for substance abuse who presented to the emergency department brought by the [facility] and IVC by one of her parents.  She was paranoid, hallucinating and very restless on arrival.  She was given Geodon and lorazepam.  She was sleeping at the time of my examination and unable to provide further information.  ED course: Initial vital signs were temperature 99.3 F, pulse 105, respiration 98, BP 98/66 mmHg, respirations 23 and O2 sat 94% on room air.  In addition to lorazepam and haloperidol she was given 2000 mL of LR bolus and KCl 10 mEq IVPB.  She pulled her IV while going to the bathroom, declined to get a reinserted, but agreed to drink fluids orally and took oral potassium.  Oral haloperidol and lorazepam have been ordered as needed.  Lab work: UDS was positive for amphetamines and tetrahydrocannabinol.  CBC is her white count 13.8, hemoglobin 14.8 g/dL platelets 008.  Alcohol, acetaminophen and salicylate were unremarkable.  CMP showed a sodium 137, potassium 2.7, chloride 104 and CO2 17 mmol/L with an anion gap of 16.  Glucose 162, BUN 18 and creatinine 1.75 mg/dL.  Total protein 9.8 and albumin 5.3 g/dL.  The rest of the hepatic functions were normal.   Review of Systems: As mentioned in the history of present illness. All other systems reviewed and are negative.  Past Medical History:  Diagnosis Date   Asthma    History reviewed. No pertinent surgical history. Social History:  reports that she has quit smoking. She has never used smokeless tobacco.  She reports current alcohol use. She reports current drug use. Drugs: Cocaine and Marijuana.  No Known Allergies  History reviewed. No pertinent family history.  Prior to Admission medications   Medication Sig Start Date End Date Taking? Authorizing Provider  divalproex (DEPAKOTE ER) 500 MG 24 hr tablet Take 1 tablet (500 mg total) by mouth at bedtime. 09/24/21 12/21/21  Lauree Chandler, NP  OLANZapine (ZYPREXA) 10 MG tablet Take 1 tablet (10 mg total) by mouth at bedtime. 09/24/21 12/21/21  Lauree Chandler, NP  ondansetron (ZOFRAN) 4 MG tablet Take 1 tablet (4 mg total) by mouth every 8 (eight) hours as needed for nausea or vomiting. 09/07/21   Tegeler, Canary Brim, MD  oxyCODONE-acetaminophen (PERCOCET/ROXICET) 5-325 MG tablet Take 1 tablet by mouth every 4 (four) hours as needed for severe pain. 09/07/21   Tegeler, Canary Brim, MD  traZODone (DESYREL) 50 MG tablet Take 1 tablet (50 mg total) by mouth at bedtime. 09/24/21 10/24/21  Lauree Chandler, NP    Physical Exam: Vitals:   10/27/21 1111 10/27/21 1248 10/27/21 1258 10/27/21 1401  BP: 95/68 106/74 110/73 (!) 150/96  Pulse: (!) 103  92 80  Resp: 20 (!) 22 (!) 22 (!) 21  SpO2: 99% 99% 100% 100%   Physical Exam Vitals and nursing note reviewed.  Constitutional:      General: She is sleeping.     Appearance: Normal appearance. She is not ill-appearing.  HENT:     Head: Normocephalic.     Mouth/Throat:  Mouth: Mucous membranes are dry.  Eyes:     General: No scleral icterus.    Pupils: Pupils are equal, round, and reactive to light.  Neck:     Vascular: No JVD.  Cardiovascular:     Rate and Rhythm: Normal rate and regular rhythm.     Heart sounds: S1 normal and S2 normal.  Pulmonary:     Effort: Pulmonary effort is normal.     Breath sounds: Normal breath sounds.  Abdominal:     General: Bowel sounds are normal. There is no distension.     Palpations: Abdomen is soft.     Tenderness: There is no abdominal  tenderness. There is no right CVA tenderness or left CVA tenderness.  Musculoskeletal:     Cervical back: Neck supple.     Right lower leg: No edema.     Left lower leg: No edema.  Skin:    General: Skin is warm and dry.  Neurological:     General: No focal deficit present.     Cranial Nerves: Cranial nerves 2-12 are intact.     Comments: Sedated.  Psychiatric:        Attention and Perception: She is inattentive.        Mood and Affect: Mood is anxious.        Thought Content: Thought content includes suicidal ideation.        Cognition and Memory: Cognition is impaired.    Data Reviewed:  There are no new results to review at this time.  Assessment and Plan: Principal Problem:   Metabolic acidosis In the setting of:   AKI (acute kidney injury) (HCC) Observation/telemetry. Continue IV fluids. Encourage oral IV fluids. Avoid hypotension. Avoid nephrotoxins. Monitor intake and output. Monitor renal function electrolytes.  Active Problems:   Psychotic episode (HCC) Has verbalized SI. Has being involuntarily committed. Secondary to unknown hallucinogen. Lorazepam as needed. Haloperidol as needed. Psych evaluation has been requested.    Hypokalemia Replacing. Follow potassium level.    Hyperglycemia Recheck glucose fasting in AM.    Leukocytosis Follow WBC in the morning.     Advance Care Planning:   Code Status: Full Code   Consults:   Family Communication:   Severity of Illness: The appropriate patient status for this patient is OBSERVATION. Observation status is judged to be reasonable and necessary in order to provide the required intensity of service to ensure the patient's safety. The patient's presenting symptoms, physical exam findings, and initial radiographic and laboratory data in the context of their medical condition is felt to place them at decreased risk for further clinical deterioration. Furthermore, it is anticipated that the patient will be  medically stable for discharge from the hospital within 2 midnights of admission.   Author: Bobette Mo, MD 10/27/2021 2:13 PM  For on call review [email or web address].   This document was prepared using Dragon voice recognition software and may contain some unintended transcription errors.

## 2021-10-28 DIAGNOSIS — E876 Hypokalemia: Secondary | ICD-10-CM

## 2021-10-28 DIAGNOSIS — R739 Hyperglycemia, unspecified: Secondary | ICD-10-CM

## 2021-10-28 DIAGNOSIS — E872 Acidosis, unspecified: Secondary | ICD-10-CM

## 2021-10-28 LAB — CK: Total CK: 972 U/L — ABNORMAL HIGH (ref 38–234)

## 2021-10-28 LAB — CBC
HCT: 37.3 % (ref 36.0–46.0)
Hemoglobin: 11.9 g/dL — ABNORMAL LOW (ref 12.0–15.0)
MCH: 28.5 pg (ref 26.0–34.0)
MCHC: 31.9 g/dL (ref 30.0–36.0)
MCV: 89.2 fL (ref 80.0–100.0)
Platelets: 243 10*3/uL (ref 150–400)
RBC: 4.18 MIL/uL (ref 3.87–5.11)
RDW: 14.7 % (ref 11.5–15.5)
WBC: 7.6 10*3/uL (ref 4.0–10.5)
nRBC: 0 % (ref 0.0–0.2)

## 2021-10-28 LAB — COMPREHENSIVE METABOLIC PANEL
ALT: 11 U/L (ref 0–44)
AST: 29 U/L (ref 15–41)
Albumin: 3.8 g/dL (ref 3.5–5.0)
Alkaline Phosphatase: 66 U/L (ref 38–126)
Anion gap: 8 (ref 5–15)
BUN: 10 mg/dL (ref 6–20)
CO2: 23 mmol/L (ref 22–32)
Calcium: 8.5 mg/dL — ABNORMAL LOW (ref 8.9–10.3)
Chloride: 106 mmol/L (ref 98–111)
Creatinine, Ser: 0.73 mg/dL (ref 0.44–1.00)
GFR, Estimated: >60 mL/min (ref >60)
Glucose, Bld: 88 mg/dL (ref 70–99)
Potassium: 3.8 mmol/L (ref 3.5–5.1)
Sodium: 137 mmol/L (ref 135–145)
Total Bilirubin: 1 mg/dL (ref 0.3–1.2)
Total Protein: 7.3 g/dL (ref 6.5–8.1)

## 2021-10-28 LAB — HIV ANTIBODY (ROUTINE TESTING W REFLEX): HIV Screen 4th Generation wRfx: NONREACTIVE

## 2021-10-28 MED ORDER — OLANZAPINE 5 MG PO TABS
10.0000 mg | ORAL_TABLET | Freq: Every day | ORAL | Status: DC
  Administered 2021-10-28: 10 mg via ORAL
  Filled 2021-10-28: qty 2

## 2021-10-28 MED ORDER — SODIUM CHLORIDE 0.9 % IV SOLN: INTRAVENOUS | Status: AC

## 2021-10-28 MED ORDER — NICOTINE POLACRILEX 2 MG MT GUM
2.0000 mg | CHEWING_GUM | OROMUCOSAL | Status: DC | PRN
  Filled 2021-10-28: qty 1

## 2021-10-28 MED ORDER — HYDROXYZINE HCL 25 MG PO TABS
25.0000 mg | ORAL_TABLET | Freq: Three times a day (TID) | ORAL | Status: DC | PRN
  Administered 2021-10-28: 25 mg via ORAL
  Filled 2021-10-28: qty 1

## 2021-10-28 MED ORDER — TRAZODONE HCL 50 MG PO TABS
50.0000 mg | ORAL_TABLET | Freq: Every day | ORAL | Status: DC
  Administered 2021-10-28: 50 mg via ORAL
  Filled 2021-10-28: qty 1

## 2021-10-28 MED ORDER — CITALOPRAM HYDROBROMIDE 20 MG PO TABS
40.0000 mg | ORAL_TABLET | Freq: Every day | ORAL | Status: DC
  Administered 2021-10-28 – 2021-10-29 (×2): 40 mg via ORAL
  Filled 2021-10-28 (×2): qty 2

## 2021-10-28 MED ORDER — DIVALPROEX SODIUM ER 500 MG PO TB24
500.0000 mg | ORAL_TABLET | Freq: Every day | ORAL | Status: DC
  Administered 2021-10-28: 500 mg via ORAL
  Filled 2021-10-28: qty 1

## 2021-10-28 MED ORDER — MEDROXYPROGESTERONE ACETATE 150 MG/ML IM SUSP
150.0000 mg | Freq: Once | INTRAMUSCULAR | Status: AC
  Administered 2021-10-28: 150 mg via INTRAMUSCULAR
  Filled 2021-10-28: qty 1

## 2021-10-28 NOTE — Discharge Instructions (Signed)
Outpatient Substance Use Treatment Services       [facility]ical Dependence Intensive Outpatient Program   [address]   [place], [place] [address]    [phone]   Private insurance, Medicare A&B, and [facility]       [facility]    [address],    [place], [place] [address]   [phone]   Medicaid, Self Pay       [facility]        [address]    [place], [place]   [phone]   Medicaid and [facility], Self Pay       [facility]   [address]    [place], [place]    [phone]   [facility], and Self Pay      [facility]        [address]      [place], [place] [address]    [phone] or [phone]   Private Insurance Only                                                 [facility]   [address]    [place], [place] [address]   [phone]   Medicaid, Medicare, Private Insurance      [facility] at the [facility]   [address]    [place], [place] [address]   [phone]   Services are free or reduced      [facility]    [address]   [phone]    Self Pay only, sliding scale      [facility]    [address]    [place], [place] [address]   [phone]   ([employer] [facility])   Self Pay, Medicaid Only       [facility]   [address], [place] [address]   [phone]   Medicaid, Medicare, Private Insurance                                                               [employer] scholarships from the [facility] to help pay for treatment    Website: [email or web address]      [address]  [place], [place] [address]   (ph) [phone]  (fax) [phone]       [facility]    [address]  [place], [place] [address]   (ph) [phone]  (fax) [phone]      [facility]   [address]  [place], [place] [address]   (ph) [phone]  (fax) [phone]      [facility]   [address], [place], [place] [address]   (ph) [phone]   (fax) [phone]      Website: [email or web address]  [facility]   Substance Abuse Intensive Outpatient Program for Adolescents   Phone: [phone]   Address: [address], [place], [place]   Website: [email or web address]         [facility]idential Substance Use [employer]support/administrative officer ([facility]    [address]    [place], [place] [address]    [phone] or [phone]   Detox (Medicare, Medicaid, private insurance, and self pay)    Residential Rehab 14 days (Medicare, Medicaid, private insurance, and self pay)       [facility]    [address] [place], [place]    [phone]    Female and Female Detox (Self Pay and Medicaid limited availability)    Rehab only Female (Medicaid and self pay only)       [facility] [address]        [address]    [place], [place] [address]    [phone] or [phone]   Detox and Residential Treatment   Private Insurance Only       [facility]    [address]    [place], [place] [address]    [phone]    Treatment Only, must make assessment appointment, and must be sober for assessment appointment.    Self Pay Only, Medicare A&B, [facility] ID only!   *Transportation assistance offered from [employer] [facility] [employer]       [address]   [place], [place] [address]   Walk in  interviews M-Sat 8-4p   No pending legal charges   [phone]               [facility]C:  [facility]   Referral    [address]   [place], [place]   [phone]   (Self Pay, [facility])      [facility]   [address]   [place], [place] [address]   [phone]   Detox and Residential Treatment   Medicare and Private Insurance      [facility]   [address]    [place], [place] [address]   28 Day Women's Facility: [phone]   28 Day Men's Facility: [phone]   Long-term Residential Program:    [phone] Males 25 and Over   (No Insurance, upfront fee)      [facility]    [address]   [place], [place] [address]   [phone]   Private Insurance with [employer], Private Pay     [facility]   [address]  [place], [place] [address]  Local?[phone]   Private Insurance Only

## 2021-10-28 NOTE — Consult Note (Addendum)
[facility] Face-to-Face Psychiatry Consult   Reason for Consult:  SI, psychosis after using hallucinogen Referring Physician:  Dr. Robb Matar Patient Identification: Tracey Lam MRN:  [id number] Principal Diagnosis: AKI (acute kidney injury) [facility]) Diagnosis:  Principal Problem:   AKI (acute kidney injury) (HCC) Active Problems:   Hypokalemia   Hyperglycemia   Metabolic acidosis   Psychotic episode (HCC)   Leukocytosis   Total Time spent with patient: 1 hour  Subjective:   Tracey Lam is a 26 y.o. female patient admitted with psychosis.  Patient endorses recently using MDMA yesterday prior to this admission. She admits she is aware of the symptoms  Patient reports the medication is effective and works, when she takes it. She states the medication has helped with her mood and denies any recent side effects. She denies any current suicidal ideation, suicidal thoughts, or self harm behaviors. She endorses history of suicide attempt by overdose, last one in 2021 after miscarriage.   Patient is calm and cooperative, exhibiting no signs of acute distress this morning. She reports feeling "normal" and significantly better compared to yesterday. The patient acknowledges the use of xcstacy and methamphetamines, which often lead to psychotic symptoms. She expresses a strong interest in substance use treatment programs but does not believe hospital admission is necessary. The patient adamantly denies experiencing suicidal ideation, homicidal ideation, or auditory/visual hallucinations. She demonstrates a high level of insight into the negative consequences of her substance use. The patient mentions that she used to take Olanzapine at night for psychosis and Depakote for mood. She expresses a desire to follow up with an outpatient psychiatrist after discharge at [facility]. The patient plans to stay with her supportive mother upon leaving the hospital and mentions that she works full-time and has no children. She does also  show insight and judgement into the context of reckless behaviors, her use of illicit substances, and pregnancy. She does provide consent to start contraceptive during this hospital admission.   Tracey Lam is a 26 y.o. female laying in bed in no acute distress, alert and oriented, does not have any complaints of pain at this time. I do not feel that she is an imminent danger to herself at this time, and she does NOT appear to be under the influence of illicit substances. Do not feel that she would benefit from psychiatric admission given that she is well-known to the emergency department, behavioral health urgent care, and has wealth of outpatient services. She has been admitted almost monthly for some form of mental illness or substance induced psychosis, and rehab/detox.  She will continue to need IVF given her electrolyte abnormality, elevated CK level and hypokalemia.    HPI:  Tracey Lam is a 26 y.o. female with medical history significant of of asthma, bipolar disorder, schizophrenia and depression with multiple hospitalizations for Sustenna abuse who presented to the emergency department brought by the [facility] Department and IVC by one of her parents.  She was paranoid, hallucinating and very restless on arrival.  She was given Geodon and lorazepam.  She was sleeping at the time of my examination and unable to provide further information.    Past Psychiatric History:  Pt w/ reported hx of "paranoid schizophrenia. Chart review shows history of Bipolar II, PTSD 2/t IPV,   Risk to Self:   Denies Risk to Others:   Denies Prior Inpatient Therapy:  Multiple more than 10x. [facility], [facility], [facility] care system, rehabilitation Prior Outpatient Therapy:   [facility]  Past Medical History:  Past Medical History:  Diagnosis Date   Asthma    History reviewed. No pertinent surgical history. Family History: History reviewed. No pertinent family history. Family Psychiatric  History: Pt denies  family psychiatric hx Social History:  Social History   Substance and Sexual Activity  Alcohol Use Yes   Comment: Occassional      Social History   Substance and Sexual Activity  Drug Use Yes   Types: Cocaine, Marijuana   Comment: pt reports she used last night     Social History   Socioeconomic History   Marital status: Single    Spouse name: Not on file   Number of children: Not on file   Years of education: Not on file   Highest education level: Not on file  Occupational History   Not on file  Tobacco Use   Smoking status: Former   Smokeless tobacco: Never  Substance and Sexual Activity   Alcohol use: Yes    Comment: Occassional    Drug use: Yes    Types: Cocaine, Marijuana    Comment: pt reports she used last night    Sexual activity: Yes  Other Topics Concern   Not on file  Social History Narrative   Not on file   Social Determinants of Health   Financial Resource Strain: Not on file  Food Insecurity: Not on file  Transportation Needs: Not on file  Physical Activity: Not on file  Stress: Not on file  Social Connections: Not on file   Additional Social History:    Allergies:  No Known Allergies  Labs:  Results for orders placed or performed during the hospital encounter of 10/27/21 (from the past 48 hour(s))  CBC with Differential/Platelet     Status: Abnormal   Collection Time: 10/27/21  9:31 AM  Result Value Ref Range   WBC 13.8 (H) 4.0 - 10.5 K/uL    Comment: WHITE COUNT CONFIRMED ON SMEAR   RBC 5.04 3.87 - 5.11 MIL/uL   Hemoglobin 14.8 12.0 - 15.0 g/dL   HCT 43.0 36.0 - 46.0 %   MCV 85.3 80.0 - 100.0 fL   MCH 29.4 26.0 - 34.0 pg   MCHC 34.4 30.0 - 36.0 g/dL   RDW 14.5 11.5 - 15.5 %   Platelets 354 150 - 400 K/uL   nRBC 0.0 0.0 - 0.2 %   Neutrophils Relative % 79 %   Neutro Abs 10.9 (H) 1.7 - 7.7 K/uL   Lymphocytes Relative 14 %   Lymphs Abs 1.9 0.7 - 4.0 K/uL   Monocytes Relative 7 %   Monocytes Absolute 1.0 0.1 - 1.0 K/uL    Eosinophils Relative 0 %   Eosinophils Absolute 0.0 0.0 - 0.5 K/uL   Basophils Relative 0 %   Basophils Absolute 0.1 0.0 - 0.1 K/uL   Immature Granulocytes 0 %   Abs Immature Granulocytes 0.05 0.00 - 0.07 K/uL    Comment: Performed at [facility], [place] [address], [place], [place] [address]  Comprehensive metabolic panel     Status: Abnormal   Collection Time: 10/27/21  9:31 AM  Result Value Ref Range   Sodium 137 135 - 145 mmol/L   Potassium 2.7 (LL) 3.5 - 5.1 mmol/L    Comment: CRITICAL RESULT CALLED TO, READ BACK BY AND VERIFIED WITH WOODY, A RN AT 1026 10/27/21 BY TIBBITTS, K    Chloride 104 98 - 111 mmol/L   CO2 17 (L) 22 - 32 mmol/L   Glucose, Bld  162 (H) 70 - 99 mg/dL    Comment: Glucose reference range applies only to samples taken after fasting for at least 8 hours.   BUN 18 6 - 20 mg/dL   Creatinine, Ser 3.53 (H) 0.44 - 1.00 mg/dL   Calcium 61.4 8.9 - 43.1 mg/dL   Total Protein 9.8 (H) 6.5 - 8.1 g/dL   Albumin 5.3 (H) 3.5 - 5.0 g/dL   AST 22 15 - 41 U/L   ALT 12 0 - 44 U/L   Alkaline Phosphatase 90 38 - 126 U/L   Total Bilirubin 0.7 0.3 - 1.2 mg/dL   GFR, Estimated 41 (L) >60 mL/min    Comment: (NOTE) Calculated using the CKD-EPI Creatinine Equation (2021)    Anion gap 16 (H) 5 - 15    Comment: Performed at [facility], [address], [place], [place] [address]  Ethanol     Status: None   Collection Time: 10/27/21  9:31 AM  Result Value Ref Range   Alcohol, Ethyl (B) <10 <10 mg/dL    Comment: (NOTE) Lowest detectable limit for serum alcohol is 10 mg/dL.  For medical purposes only. Performed at [facility], [address], [place], [place] [address]   Acetaminophen level     Status: Abnormal   Collection Time: 10/27/21  9:31 AM  Result Value Ref Range   Acetaminophen (Tylenol), Serum <10 (L) 10 - 30 ug/mL    Comment: (NOTE) Therapeutic concentrations vary significantly. A range of 10-30 ug/mL  may  be an effective concentration for many patients. However, some  are best treated at concentrations outside of this range. Acetaminophen concentrations >150 ug/mL at 4 hours after ingestion  and >50 ug/mL at 12 hours after ingestion are often associated with  toxic reactions.  Performed at [facility], [address] [place], [place] [address]   Salicylate level     Status: Abnormal   Collection Time: 10/27/21  9:31 AM  Result Value Ref Range   Salicylate Lvl <7.0 (L) 7.0 - 30.0 mg/dL    Comment: Performed at [facility], [address], [place], [place] [address]  Valproic acid level     Status: Abnormal   Collection Time: 10/27/21  9:31 AM  Result Value Ref Range   Valproic Acid Lvl <10 (L) 50.0 - 100.0 ug/mL    Comment: RESULT CONFIRMED BY MANUAL DILUTION Performed at [facility], [address] [place], [place] [address]   I-Stat beta hCG blood, ED ([facility], [facility], [facility] only)     Status: None   Collection Time: 10/27/21  9:43 AM  Result Value Ref Range   I-stat hCG, quantitative <5.0 <5 mIU/mL   Comment 3            Comment:   GEST. AGE      CONC.  (mIU/mL)   <=1 WEEK        5 - 50     2 WEEKS       50 - 500     3 WEEKS       100 - 10,000     4 WEEKS     1,000 - 30,000        FEMALE AND NON-PREGNANT FEMALE:     LESS THAN 5 mIU/mL   CK     Status: Abnormal   Collection Time: 10/27/21 11:14 AM  Result Value Ref Range   Total CK 426 (H) 38 - 234 U/L  Comment: Performed at [facility], [place] [address], [place], [place] [address]  Magnesium     Status: None   Collection Time: 10/27/21 11:14 AM  Result Value Ref Range   Magnesium 1.8 1.7 - 2.4 mg/dL    Comment: Performed at [facility], [place] [address], [place], [place] [address]  Rapid urine drug screen (hospital performed)     Status: Abnormal   Collection Time: 10/27/21  6:30 PM  Result Value Ref Range   Opiates NONE DETECTED NONE  DETECTED   Cocaine NONE DETECTED NONE DETECTED   Benzodiazepines NONE DETECTED NONE DETECTED   Amphetamines POSITIVE (A) NONE DETECTED   Tetrahydrocannabinol POSITIVE (A) NONE DETECTED   Barbiturates NONE DETECTED NONE DETECTED    Comment: (NOTE) DRUG SCREEN FOR MEDICAL PURPOSES ONLY.  IF CONFIRMATION IS NEEDED FOR ANY PURPOSE, NOTIFY LAB WITHIN 5 DAYS.  LOWEST DETECTABLE LIMITS FOR URINE DRUG SCREEN Drug Class                     Cutoff (ng/mL) Amphetamine and metabolites    1000 Barbiturate and metabolites    200 Benzodiazepine                 [id number] Tricyclics and metabolites     300 Opiates and metabolites        300 Cocaine and metabolites        300 THC                            50 Performed at [facility], [place] [address] [place], [place] [address]   CBC     Status: Abnormal   Collection Time: 10/28/21  8:13 AM  Result Value Ref Range   WBC 7.6 4.0 - 10.5 K/uL   RBC 4.18 3.87 - 5.11 MIL/uL   Hemoglobin 11.9 (L) 12.0 - 15.0 g/dL   HCT 37.3 36.0 - 46.0 %   MCV 89.2 80.0 - 100.0 fL   MCH 28.5 26.0 - 34.0 pg   MCHC 31.9 30.0 - 36.0 g/dL   RDW 14.7 11.5 - 15.5 %   Platelets 243 150 - 400 K/uL   nRBC 0.0 0.0 - 0.2 %    Comment: Performed at [facility], [place] [address] [place], [place] [address]  Comprehensive metabolic panel     Status: Abnormal   Collection Time: 10/28/21  8:13 AM  Result Value Ref Range   Sodium 137 135 - 145 mmol/L   Potassium 3.8 3.5 - 5.1 mmol/L   Chloride 106 98 - 111 mmol/L   CO2 23 22 - 32 mmol/L   Glucose, Bld 88 70 - 99 mg/dL    Comment: Glucose reference range applies only to samples taken after fasting for at least 8 hours.   BUN 10 6 - 20 mg/dL   Creatinine, Ser 0.73 0.44 - 1.00 mg/dL   Calcium 8.5 (L) 8.9 - 10.3 mg/dL   Total Protein 7.3 6.5 - 8.1 g/dL   Albumin 3.8 3.5 - 5.0 g/dL   AST 29 15 - 41 U/L   ALT 11 0 - 44 U/L   Alkaline Phosphatase 66 38 - 126 U/L   Total Bilirubin 1.0 0.3 - 1.2  mg/dL   GFR, Estimated >60 >60 mL/min    Comment: (NOTE) Calculated using the CKD-EPI Creatinine Equation (2021)    Anion gap 8 5 - 15  Comment: Performed at [facility], [place] [address], [place], [place] [address]  CK     Status: Abnormal   Collection Time: 10/28/21  8:13 AM  Result Value Ref Range   Total CK 972 (H) 38 - 234 U/L    Comment: Performed at [facility], [place] [address] [place], [place] [address]    Current Facility-Administered Medications  Medication Dose Route Frequency Provider Last Rate Last Admin   0.9 %  sodium chloride infusion   Intravenous Continuous Charlynne Cousins, MD 100 mL/hr at 10/28/21 0847 New Bag at 10/28/21 0847   acetaminophen (TYLENOL) tablet 650 mg  650 mg Oral Q6H PRN Reubin Milan, MD       Or   acetaminophen (TYLENOL) suppository 650 mg  650 mg Rectal Q6H PRN Reubin Milan, MD       citalopram (CELEXA) tablet 40 mg  40 mg Oral Daily Charlynne Cousins, MD   40 mg at 10/28/21 0828   divalproex (DEPAKOTE ER) 24 hr tablet 500 mg  500 mg Oral QHS Charlynne Cousins, MD       enoxaparin (LOVENOX) injection 40 mg  40 mg Subcutaneous Q24H Reubin Milan, MD       feeding supplement (ENSURE ENLIVE / ENSURE PLUS) liquid 237 mL  237 mL Oral BID BM Reubin Milan, MD   237 mL at 10/28/21 0832   haloperidol (HALDOL) tablet 5 mg  5 mg Oral Q6H PRN Reubin Milan, MD   5 mg at 10/27/21 2219   Or   haloperidol lactate (HALDOL) injection 5 mg  5 mg Intravenous Q6H PRN Reubin Milan, MD       lactated ringers infusion   Intravenous Continuous Reubin Milan, MD 125 mL/hr at 10/28/21 0835 New Bag at 10/28/21 0835   LORazepam (ATIVAN) tablet 1 mg  1 mg Oral Q4H PRN Reubin Milan, MD   1 mg at 10/28/21 0828   OLANZapine (ZYPREXA) tablet 10 mg  10 mg Oral QHS Charlynne Cousins, MD       ondansetron [facility]) tablet 4 mg  4 mg Oral Q6H PRN Reubin Milan, MD       Or    ondansetron [facility]) injection 4 mg  4 mg Intravenous Q6H PRN Reubin Milan, MD        Musculoskeletal: Strength & Muscle Tone: within normal limits Gait & Station: normal Patient leans: N/A            Psychiatric Specialty Exam:  Presentation  General Appearance: Disheveled  Eye Contact:Fair  Speech:Clear and Coherent; Normal Rate  Speech Volume:Normal  Handedness:Right   Mood and Affect  Mood:Euthymic  Affect:Appropriate; Congruent   Thought Process  Thought Processes:Coherent; Linear  Descriptions of Associations:Intact  Orientation:Full (Time, Place and Person)  Thought Content:Logical  History of Schizophrenia/Schizoaffective disorder:Yes  Duration of Psychotic Symptoms:N/A  Hallucinations:Hallucinations: None  Ideas of Reference:None  Suicidal Thoughts:Suicidal Thoughts: No  Homicidal Thoughts:Homicidal Thoughts: No   Sensorium  Memory:Immediate Good; Recent Good; Remote Good  Judgment:Fair  Insight:Fair   Executive Functions  Concentration:Good  Attention Span:Good  [place] of Knowledge:Good  Language:Good   Psychomotor Activity  Psychomotor Activity:Psychomotor Activity: Normal   Assets  Assets:Communication Skills; Desire for Improvement; Financial Resources/Insurance; Social Support   Sleep  Sleep:Sleep: Fair   Physical Exam: Physical Exam ROS Blood pressure 115/70, pulse (!) 105, temperature 98.2 F (36.8 C), temperature source Oral, resp. rate 18, SpO2  100 %. There is no height or weight on file to calculate BMI.   In regards to her suicidality and mood symptoms, her history is pertinent for significant trauma and substance-induced mood disorder. She was endorsing psychosis that arose yesterday in the setting of MDMA and methamphetamine use. Per chart review, she had similar psychosis in the setting of [facility] during her last admission at [facility]  that remitted after she was outside of the  withdrawal window. Her symptoms at this time would not meet DSM-V criteria for MDD given time course. 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 Also concern for co-occurring PTSD given symptoms of hypervigilance, and avoidance that caused significant distress in her life. She endorses current stressors that include "trying to find balance between work and home, boyfriend issues, and using illicit substances that I shouldn't be using."   In regards to her auditory hallucinations, these arose in the setting of substance use and are intermittent, which aligns with a substance-induced psychosis rather than a primary thought disorder. She does not require additional treatment with an IM antipsychotic or inpatient given minimal current symptoms at this time.    Treatment Plan Summary: Medication management and Plan WIll resume home medications at this time.  -Refrain from use of illicit substances that can increase or worsen psychosis.  -Rescind IVC at this time.  -Will start Hydroxyzine 25mg  po TID prn for anxiety.  -Refer to the [facility] for PHP.  -Depo Provera 150mg  IM in a single dose ordered for contraception at this time. Next dose will be due in 10-12 weeks.   Disposition: No evidence of imminent risk to self or others at present.   Patient does not meet criteria for psychiatric inpatient admission. Supportive therapy provided about ongoing stressors. Refer to IOP. Discussed crisis plan, support from social network, calling 911, coming to the Emergency Department, and calling Suicide Hotline.  , FNP 10/28/2021 11:15 AM

## 2021-10-28 NOTE — Progress Notes (Signed)
[facility] PROGRESS NOTE    Progress Note  Tracey Lam  ACZ:[id number] DOB: 07-19-1995 DOA: 10/27/2021 PCP: Patient, No Pcp Per     Brief Narrative:   Tracey Lam is an 26 y.o. female past medical history significant of asthma, bipolar disorder, schizophrenia and depression with multiple hallucinations for substance abuse brought into the ED by [facility] police and IVC by one of her parents as she was paranoid hallucinating and restless to liter fluid bolus Haldol, Geodon and lorazepam were given in the ED  Assessment/Plan:   AKI (acute kidney injury) (HCC) Admitted under observation. Discontinue telemetry. Likely prerenal azotemia, with a baseline creatinine of less than 1. Started on aggressive fluid resuscitation, will continue IV fluids. Basic metabolic panel is pending this morning.  Hypokalemia Likely due to decreased oral intake replete orally, basic metabolic panels pending.  Hyperglycemia Likely reactive continue to monitor.   Normal anion gap metabolic acidosis Likely due to renal dysfunction Labs are pending this morning.  Psychotic episode (HCC) in the setting of schizophrenia Verbalizes suicidal ideation secondary to hallucinations. She has been IVC. Continue lorazepam and Haldol. We will go ahead and resume her Zyprexa. Psych has been consulted.  Leukocytosis Of unclear etiology, has remained afebrile.   DVT prophylaxis: loveno Family Communication:mother Status is: Observation The patient will require care spanning > 2 midnights and should be moved to inpatient because: Likely due to acute kidney injury, hyperglycemia and leukocytosis.    Code Status:     Code Status Orders  (From admission, onward)           Start     Ordered   10/27/21 1410  Full code  Continuous        10/27/21 1410           Code Status History     Date Active Date Inactive Code Status Order ID Comments User Context   09/23/2021 1648 09/24/2021 1733 Full  Code [id number]  Talmage Nap, NP ED   12/04/2019 2357 12/06/2019 1031 Full Code [id number]  Gillermo Murdoch, NP ED         IV Access:   Peripheral IV   Procedures and diagnostic studies:   No results found.   Medical Consultants:   None.   Subjective:    Tracey Lam relates she is thirsty.  Objective:    Vitals:   10/27/21 1816 10/27/21 2011 10/27/21 2343 10/28/21 0704  BP: 113/83 112/71 117/86 115/70  Pulse: (!) 111 (!) 106 100 (!) 105  Resp: 18 18 18 18   Temp: 97.8 F (36.6 C) 97.8 F (36.6 C) 98.1 F (36.7 C) 98.2 F (36.8 C)  TempSrc:  Oral Oral Oral  SpO2: 100% 100% 100% 100%   SpO2: 100 %   Intake/Output Summary (Last 24 hours) at 10/28/2021 0741 Last data filed at 10/28/2021 0300 Gross per 24 hour  Intake 403.03 ml  Output 400 ml  Net 3.03 ml   There were no vitals filed for this visit.  Exam: General exam: In no acute distress. Respiratory system: Good air movement and clear to auscultation. Cardiovascular system: S1 & S2 heard, RRR. No JVD. Gastrointestinal system: Abdomen is nondistended, soft and nontender.  Extremities: No pedal edema. Skin: No rashes, lesions or ulcers Psychiatry: Judgement and insight appear normal. Mood & affect appropriate.    Data Reviewed:    Labs: Basic Metabolic Panel: Recent Labs  Lab 10/27/21 0931 10/27/21 1114  NA 137  --   K 2.7*  --  CL 104  --   CO2 17*  --   GLUCOSE 162*  --   BUN 18  --   CREATININE 1.75*  --   CALCIUM 10.0  --   MG  --  1.8   GFR CrCl cannot be calculated (Unknown ideal weight.). Liver Function Tests: Recent Labs  Lab 10/27/21 0931  AST 22  ALT 12  ALKPHOS 90  BILITOT 0.7  PROT 9.8*  ALBUMIN 5.3*   No results for input(s): "LIPASE", "AMYLASE" in the last 168 hours. No results for input(s): "AMMONIA" in the last 168 hours. Coagulation profile No results for input(s): "INR", "PROTIME" in the last 168 hours. COVID-19 Labs  No results for input(s):  "DDIMER", "FERRITIN", "LDH", "CRP" in the last 72 hours.  Lab Results  Component Value Date   SARSCOV2NAA NEGATIVE 09/23/2021   SARSCOV2NAA NEGATIVE 09/07/2021   SARSCOV2NAA POSITIVE (A) 04/14/2020   SARSCOV2NAA NEGATIVE 12/05/2019    CBC: Recent Labs  Lab 10/27/21 0931  WBC 13.8*  NEUTROABS 10.9*  HGB 14.8  HCT 43.0  MCV 85.3  PLT 354   Cardiac Enzymes: Recent Labs  Lab 10/27/21 1114  CKTOTAL 426*   BNP (last 3 results) No results for input(s): "PROBNP" in the last 8760 hours. CBG: No results for input(s): "GLUCAP" in the last 168 hours. D-Dimer: No results for input(s): "DDIMER" in the last 72 hours. Hgb A1c: No results for input(s): "HGBA1C" in the last 72 hours. Lipid Profile: No results for input(s): "CHOL", "HDL", "LDLCALC", "TRIG", "CHOLHDL", "LDLDIRECT" in the last 72 hours. Thyroid function studies: No results for input(s): "TSH", "T4TOTAL", "T3FREE", "THYROIDAB" in the last 72 hours.  Invalid input(s): "FREET3" Anemia work up: No results for input(s): "VITAMINB12", "FOLATE", "FERRITIN", "TIBC", "IRON", "RETICCTPCT" in the last 72 hours. Sepsis Labs: Recent Labs  Lab 10/27/21 0931  WBC 13.8*   Microbiology No results found for this or any previous visit (from the past 240 hour(s)).   Medications:    enoxaparin (LOVENOX) injection  40 mg Subcutaneous Q24H   feeding supplement  237 mL Oral BID BM   Continuous Infusions:  lactated ringers 125 mL/hr at 10/28/21 0034      LOS: 0 days   Marinda Elk  [facility]  10/28/2021, 7:41 AM

## 2021-10-28 NOTE — Progress Notes (Signed)
Chaplain received a consult that Tracey Lam wanted to talk.  Chaplain attempted to see Tracey Lam, but she was asleep.  Chaplain will attempt at a later time.  [address] [facility] Pager, [phone]

## 2021-10-28 NOTE — TOC Initial Note (Addendum)
Transition of Care [facility]) - Initial/Assessment Note    Patient Details  Name: Tracey Lam MRN: [id number] Date of Birth: [birth year]  Transition of Care [facility]) CM/SW Contact:    Lanier Clam, RN Phone Number: 10/28/2021, 11:14 AM  Clinical Narrative: IVC;await psych eval.   -7/17 once Psych note in place to confirm d/c plan-will rescind IVC. Otpt resources already placed on d/c instructions. -3p-IVC rescinded. No further CM needs.               Expected Discharge Plan:  (TBD) Barriers to Discharge: Continued Medical Work up   Patient Goals and CMS Choice Patient states their goals for this hospitalization and ongoing recovery are:: Home CMS Medicare.gov Compare Post Acute Care list provided to:: Patient    Expected Discharge Plan and Services Expected Discharge Plan:  (TBD)   Discharge Planning Services: CM Consult   Living arrangements for the past 2 months: Single Family Home                                      Prior Living Arrangements/Services Living arrangements for the past 2 months: Single Family Home Lives with:: Parents Patient language and need for interpreter reviewed:: Yes Do you feel safe going back to the place where you live?: Yes      Need for Family Participation in Patient Care: Yes (Comment) Care giver support system in place?: Yes (comment)   Criminal Activity/Legal Involvement Pertinent to Current Situation/Hospitalization: No - Comment as needed  Activities of Daily Living Home Assistive Devices/Equipment: None ADL Screening (condition at time of admission) Patient's cognitive ability adequate to safely complete daily activities?: Yes Is the patient deaf or have difficulty hearing?: No Does the patient have difficulty seeing, even when wearing glasses/contacts?: No Does the patient have difficulty concentrating, remembering, or making decisions?: No Patient able to express need for assistance with ADLs?: Yes Does the patient have  difficulty dressing or bathing?: No Independently performs ADLs?: Yes (appropriate for developmental age) Does the patient have difficulty walking or climbing stairs?: No Weakness of Legs: None Weakness of Arms/Hands: None  Permission Sought/Granted Permission sought to share information with : Case Manager Permission granted to share information with : Yes, Verbal Permission Granted  Share Information with NAME: Case Manager           Emotional Assessment Appearance:: Appears stated age Attitude/Demeanor/Rapport: Gracious Affect (typically observed): Accepting Orientation: : Oriented to Self, Oriented to Place, Oriented to  Time, Oriented to Situation Alcohol / Substance Use: Illicit Drugs Psych Involvement: Yes (comment)  Admission diagnosis:  Hypokalemia [E87.6] AKI (acute kidney injury) (HCC) [N17.9] Patient Active Problem List   Diagnosis Date Noted   AKI (acute kidney injury) (HCC) 10/27/2021   Hypokalemia 10/27/2021   Hyperglycemia 10/27/2021   Metabolic acidosis 10/27/2021   Psychotic episode (HCC) 10/27/2021   Leukocytosis 10/27/2021   Substance induced mood disorder (HCC) 09/23/2021   Polysubstance abuse (HCC) 09/23/2021   Current severe episode of major depressive disorder without psychotic features without prior episode (HCC) 12/04/2019   Drug dependence, abuse (HCC) 12/04/2019   Drug withdrawal syndrome (HCC) 12/04/2019   PCP:  Patient, No Pcp Per Pharmacy:   [facility] [address] [place], [place] - [address] [place] [address] Phone: [phone] Fax: [phone]  [facility] [address] [address] Phone: [phone] Fax: [phone]     Social  Determinants of Health (SDOH) Interventions    Readmission Risk Interventions     No data to display

## 2021-10-29 LAB — BASIC METABOLIC PANEL
Anion gap: 3 — ABNORMAL LOW (ref 5–15)
BUN: 8 mg/dL (ref 6–20)
CO2: 27 mmol/L (ref 22–32)
Calcium: 8.6 mg/dL — ABNORMAL LOW (ref 8.9–10.3)
Chloride: 110 mmol/L (ref 98–111)
Creatinine, Ser: 0.65 mg/dL (ref 0.44–1.00)
GFR, Estimated: >60 mL/min (ref >60)
Glucose, Bld: 94 mg/dL (ref 70–99)
Potassium: 4.1 mmol/L (ref 3.5–5.1)
Sodium: 140 mmol/L (ref 135–145)

## 2021-10-29 NOTE — Progress Notes (Signed)
Pt mother called for transport home.  Pts mother expressed concerns over pt safety.  Pt seen by psych and IVC discontinued.  MD order to discharge pt home.  RN confirmed pt denies self-harming thoughts.  Pt requesting transportation home.  TOC notified.  Awaiting transport.

## 2021-10-29 NOTE — Progress Notes (Signed)
Pt to discharge after morning labs result.

## 2021-10-29 NOTE — TOC Transition Note (Signed)
Transition of Care [facility]) - CM/SW Discharge Note   Patient Details  Name: Tracey Lam MRN: [id number] Date of Birth: Jun 29, 1995  Transition of Care [facility]) CM/SW Contact:  Lanier Clam, RN Phone Number: 10/29/2021, 10:15 AM   Clinical Narrative: d/c home. IVC rescinded yesterday. Otpt mental health resources on d/c instructions.No further CM needs.       Final next level of care: Home/Self Care Barriers to Discharge: No Barriers Identified   Patient Goals and CMS Choice Patient states their goals for this hospitalization and ongoing recovery are:: Home CMS Medicare.gov Compare Post Acute Care list provided to:: Patient    Discharge Placement                       Discharge Plan and Services   Discharge Planning Services: CM Consult                                 Social Determinants of Health (SDOH) Interventions     Readmission Risk Interventions     No data to display

## 2021-10-29 NOTE — Discharge Summary (Signed)
Physician Discharge Summary  Tracey Lam [id number] DOB: Dec 20, 1995 DOA: 10/27/2021  PCP: Patient, No Pcp Per  Admit date: 10/27/2021 Discharge date: 10/29/2021  Admitted From: Home Disposition:  home  Recommendations for Outpatient Follow-up:  Follow up with psychiatry in 1-2 weeks   Home Health:No Equipment/Devices:none Discharge Condition:Stable CODE STATUS:Full Diet recommendation: Heart Healthy  Brief/Interim Summary:  26 y.o. female past medical history significant of asthma, bipolar disorder, schizophrenia and depression with multiple hallucinations for substance abuse brought into the ED by [place] police and IVC by one of her parents as she was paranoid hallucinating and restless to liter fluid bolus Haldol, Geodon and lorazepam were given in the ED    Discharge Diagnoses:  Principal Problem:   AKI (acute kidney injury) (HCC) Active Problems:   Hypokalemia   Hyperglycemia   Metabolic acidosis   Psychotic episode (HCC)   Leukocytosis  Acute kidney injury: Likely prerenal azotemia she was fluid resuscitated her creatinine returned to baseline.  Hypokalemia: Replete orally now resolved.  Hyperglycemia: Likely reactive.  Normal anion gap metabolic acidosis:   likely due to renal dysfunction resolved with fluids rotation.  Psychotic episodes in the setting of schizophrenia: In the setting of bugs and hallucinogens. Psych was consulted recommended to resume her medication and follow-up with psychiatry as an outpatient.  Discharge Instructions  Discharge Instructions     Diet - low sodium heart healthy   Complete by: As directed    Increase activity slowly   Complete by: As directed       Allergies as of 10/29/2021   No Known Allergies      Medication List     TAKE these medications    ARIPiprazole 15 MG tablet Commonly known as: ABILIFY Take 15 mg by mouth in the morning.   citalopram 20 MG tablet Commonly known as: CELEXA Take 40 mg  by mouth daily.   divalproex 500 MG 24 hr tablet Commonly known as: DEPAKOTE ER Take 1 tablet (500 mg total) by mouth at bedtime.   OLANZapine 10 MG tablet Commonly known as: ZYPREXA Take 1 tablet (10 mg total) by mouth at bedtime.   ondansetron 4 MG tablet Commonly known as: ZOFRAN Take 1 tablet (4 mg total) by mouth every 8 (eight) hours as needed for nausea or vomiting.   oxyCODONE-acetaminophen 5-325 MG tablet Commonly known as: PERCOCET/ROXICET Take 1 tablet by mouth every 4 (four) hours as needed for severe pain.   traZODone 50 MG tablet Commonly known as: DESYREL Take 1 tablet (50 mg total) by mouth at bedtime. What changed: how much to take        No Known Allergies  Consultations: Psychiatry   Procedures/Studies: No results found. (Echo, Carotid, EGD, Colonoscopy, ERCP)    Subjective: No complaints.  Discharge Exam: Vitals:   10/28/21 2028 10/29/21 0649  BP: 110/65 109/66  Pulse: 78 (!) 103  Resp: 18 16  Temp: 98 F (36.7 C) 97.9 F (36.6 C)  SpO2: 100% 100%   Vitals:   10/28/21 0704 10/28/21 1314 10/28/21 2028 10/29/21 0649  BP: 115/70 119/67 110/65 109/66  Pulse: (!) 105 79 78 (!) 103  Resp: 18 18 18 16   Temp: 98.2 F (36.8 C) 98 F (36.7 C) 98 F (36.7 C) 97.9 F (36.6 C)  TempSrc: Oral Oral    SpO2: 100% 100% 100% 100%    General: Pt is alert, awake, not in acute distress Cardiovascular: RRR, S1/S2 +, no rubs, no gallops Respiratory: CTA bilaterally, no wheezing, no rhonchi  Abdominal: Soft, NT, ND, bowel sounds + Extremities: no edema, no cyanosis    The results of significant diagnostics from this hospitalization (including imaging, microbiology, ancillary and laboratory) are listed below for reference.     Microbiology: No results found for this or any previous visit (from the past 240 hour(s)).   Labs: BNP (last 3 results) No results for input(s): "BNP" in the last 8760 hours. Basic Metabolic Panel: Recent Labs  Lab  10/27/21 0931 10/27/21 1114 10/28/21 0813  NA 137  --  137  K 2.7*  --  3.8  CL 104  --  106  CO2 17*  --  23  GLUCOSE 162*  --  88  BUN 18  --  10  CREATININE 1.75*  --  0.73  CALCIUM 10.0  --  8.5*  MG  --  1.8  --    Liver Function Tests: Recent Labs  Lab 10/27/21 0931 10/28/21 0813  AST 22 29  ALT 12 11  ALKPHOS 90 66  BILITOT 0.7 1.0  PROT 9.8* 7.3  ALBUMIN 5.3* 3.8   No results for input(s): "LIPASE", "AMYLASE" in the last 168 hours. No results for input(s): "AMMONIA" in the last 168 hours. CBC: Recent Labs  Lab 10/27/21 0931 10/28/21 0813  WBC 13.8* 7.6  NEUTROABS 10.9*  --   HGB 14.8 11.9*  HCT 43.0 37.3  MCV 85.3 89.2  PLT 354 243   Cardiac Enzymes: Recent Labs  Lab 10/27/21 1114 10/28/21 0813  CKTOTAL 426* 972*   BNP: Invalid input(s): "POCBNP" CBG: No results for input(s): "GLUCAP" in the last 168 hours. D-Dimer No results for input(s): "DDIMER" in the last 72 hours. Hgb A1c No results for input(s): "HGBA1C" in the last 72 hours. Lipid Profile No results for input(s): "CHOL", "HDL", "LDLCALC", "TRIG", "CHOLHDL", "LDLDIRECT" in the last 72 hours. Thyroid function studies No results for input(s): "TSH", "T4TOTAL", "T3FREE", "THYROIDAB" in the last 72 hours.  Invalid input(s): "FREET3" Anemia work up No results for input(s): "VITAMINB12", "FOLATE", "FERRITIN", "TIBC", "IRON", "RETICCTPCT" in the last 72 hours. Urinalysis    Component Value Date/Time   COLORURINE BROWN (A) 09/24/2021 0820   APPEARANCEUR TURBID (A) 09/24/2021 0820   LABSPEC >1.030 (H) 09/24/2021 0820   PHURINE 6.0 09/24/2021 0820   GLUCOSEU NEGATIVE 09/24/2021 0820   HGBUR NEGATIVE 09/24/2021 0820   BILIRUBINUR NEGATIVE 09/24/2021 0820   KETONESUR NEGATIVE 09/24/2021 0820   PROTEINUR 100 (A) 09/24/2021 0820   NITRITE NEGATIVE 09/24/2021 0820   LEUKOCYTESUR NEGATIVE 09/24/2021 0820   Sepsis Labs Recent Labs  Lab 10/27/21 0931 10/28/21 0813  WBC 13.8* 7.6    Microbiology No results found for this or any previous visit (from the past 240 hour(s)).  SIGNED:   Marinda Elk, MD  [facility] 10/29/2021, 9:54 AM Pager   If 7PM-7AM, please contact night-coverage [email or web address] Password [email or web address]

## 2021-10-29 NOTE — Progress Notes (Signed)
Chaplain attempted follow up to see Tracey Lam.  She was standing in the doorway and stated that she did want to talk but that now was not a good time.  When chaplain returned at a later time, she had already discharged.  [address], [facility] Pager, [phone]

## 2021-10-29 NOTE — Progress Notes (Signed)
Order to discharge pt home.  Discharge instructions/AVS given to patient and reviewed - education provided as needed.  Pt advised to call PCP and/or come back to the hospital if there are any problems. Pt verbalized understanding.    

## 2021-10-29 NOTE — Plan of Care (Signed)

## 2021-10-29 NOTE — TOC Transition Note (Addendum)
Transition of Care [facility]) - CM/SW Discharge Note   Patient Details  Name: Tracey Lam MRN: [id number] Date of Birth: 1996-04-14  Transition [facility] Care [facility]) CM/SW Contact:  Lanier Clam, RN Phone Number: 10/29/2021, 11:59 AM   Clinical Narrative: Request for transportation asst-assessed patient-She is A+0x3,ambulatory, to receive otpt mental health services, doesn't have any money,home is safe, she has already called her family but they are unable to pick her up. Will provide taxi voucher-Confirmed address [address],[place]-she is able to get into house-family is there to open door. Nsg has taxi voucher. [name] has been called-Nsg aware to bring patient to main entrance.No further CM needs.       Final next level of care: Home/Self Care Barriers to Discharge: No Barriers Identified   Patient Goals and CMS Choice Patient states their goals for this hospitalization and ongoing recovery are:: Home CMS Medicare.gov Compare Post Acute Care list provided to:: Patient    Discharge Placement                       Discharge Plan and Services   Discharge Planning Services: CM Consult                                 Social Determinants of Health (SDOH) Interventions     Readmission Risk Interventions     No data to display

## 2022-03-05 ENCOUNTER — Emergency Department (HOSPITAL_COMMUNITY)
Admission: EM | Admit: 2022-03-05 | Discharge: 2022-03-05 | Discharge status: Against Medical Advice | Payer: Self-pay | Attending: Nurse Practitioner | Admitting: Nurse Practitioner

## 2022-03-05 ENCOUNTER — Emergency Department (HOSPITAL_COMMUNITY): Payer: Self-pay

## 2022-03-05 ENCOUNTER — Other Ambulatory Visit: Payer: Self-pay

## 2022-03-05 DIAGNOSIS — Z5321 Procedure and treatment not carried out due to patient leaving prior to being seen by health care provider: Secondary | ICD-10-CM | POA: Insufficient documentation

## 2022-03-05 DIAGNOSIS — R200 Anesthesia of skin: Secondary | ICD-10-CM | POA: Insufficient documentation

## 2022-03-05 LAB — PREGNANCY, URINE: Preg Test, Ur: NEGATIVE

## 2022-03-05 MED ORDER — ACETAMINOPHEN 500 MG PO TABS
1000.0000 mg | ORAL_TABLET | Freq: Once | ORAL | Status: AC
  Administered 2022-03-05: 1000 mg via ORAL
  Filled 2022-03-05: qty 2

## 2022-03-05 NOTE — ED Triage Notes (Signed)
C/o upper back pain with numbness and decreased ROM to bilateral hands x3 days.  Pain and numbness worse when laying down.

## 2022-03-05 NOTE — ED Provider Triage Note (Signed)
Emergency Medicine Provider Triage Evaluation Note  Tracey Lam , a 26 y.o. female  was evaluated in triage.  Pt complains of bilateral upper extremity numbness and pain, sudden onset, three days ago. No known injury.  Review of Systems  Positive: Neck pain, upper back pain. Extremity pain with numbness Negative: Fever, chills, abdominal pain, low back pain  Physical Exam  BP (!) 152/89   Pulse 80   Temp 98.5 F (36.9 C) (Oral)   Resp 18   Ht 4\' 10"  (1.473 m)   Wt 74 kg   SpO2 98%   BMI 34.10 kg/m  Gen:   Awake, no distress   Resp:  Normal effort  MSK:   Moves extremities without difficulty. Subjectlive weak grip bilaterally  Other:    Medical Decision Making  Medically screening exam initiated at 5:17 PM.  Appropriate orders placed.  Randi Poullard was informed that the remainder of the evaluation will be completed by another provider, this initial triage assessment does not replace that evaluation, and the importance of remaining in the ED until their evaluation is complete.     Schertz Lions, NP 03/05/22 2200

## 2022-07-14 ENCOUNTER — Other Ambulatory Visit: Payer: Self-pay

## 2022-07-14 ENCOUNTER — Encounter (HOSPITAL_COMMUNITY): Payer: Self-pay

## 2022-07-14 ENCOUNTER — Inpatient Hospital Stay (HOSPITAL_COMMUNITY)
Admission: EM | Admit: 2022-07-14 | Discharge: 2022-07-18 | DRG: 896 | Disposition: A | Discharge status: Other Acute Inpatient Hospital | Payer: 59 | Attending: Internal Medicine | Admitting: Internal Medicine

## 2022-07-14 DIAGNOSIS — E876 Hypokalemia: Secondary | ICD-10-CM | POA: Diagnosis present

## 2022-07-14 DIAGNOSIS — R7401 Elevation of levels of liver transaminase levels: Secondary | ICD-10-CM | POA: Diagnosis present

## 2022-07-14 DIAGNOSIS — N179 Acute kidney failure, unspecified: Secondary | ICD-10-CM | POA: Diagnosis present

## 2022-07-14 DIAGNOSIS — J4520 Mild intermittent asthma, uncomplicated: Secondary | ICD-10-CM | POA: Diagnosis present

## 2022-07-14 DIAGNOSIS — E872 Acidosis, unspecified: Secondary | ICD-10-CM | POA: Diagnosis present

## 2022-07-14 DIAGNOSIS — F1914 Other psychoactive substance abuse with psychoactive substance-induced mood disorder: Principal | ICD-10-CM | POA: Diagnosis present

## 2022-07-14 DIAGNOSIS — Z9151 Personal history of suicidal behavior: Secondary | ICD-10-CM

## 2022-07-14 DIAGNOSIS — F1510 Other stimulant abuse, uncomplicated: Secondary | ICD-10-CM | POA: Diagnosis present

## 2022-07-14 DIAGNOSIS — F3181 Bipolar II disorder: Secondary | ICD-10-CM | POA: Diagnosis present

## 2022-07-14 DIAGNOSIS — R4182 Altered mental status, unspecified: Secondary | ICD-10-CM | POA: Diagnosis present

## 2022-07-14 DIAGNOSIS — R45851 Suicidal ideations: Secondary | ICD-10-CM | POA: Diagnosis present

## 2022-07-14 DIAGNOSIS — E860 Dehydration: Secondary | ICD-10-CM | POA: Diagnosis present

## 2022-07-14 DIAGNOSIS — F1990 Other psychoactive substance use, unspecified, uncomplicated: Principal | ICD-10-CM

## 2022-07-14 DIAGNOSIS — F23 Brief psychotic disorder: Secondary | ICD-10-CM | POA: Diagnosis present

## 2022-07-14 DIAGNOSIS — G4700 Insomnia, unspecified: Secondary | ICD-10-CM | POA: Diagnosis present

## 2022-07-14 DIAGNOSIS — Z87891 Personal history of nicotine dependence: Secondary | ICD-10-CM

## 2022-07-14 DIAGNOSIS — M6282 Rhabdomyolysis: Secondary | ICD-10-CM | POA: Diagnosis not present

## 2022-07-14 DIAGNOSIS — R000 Tachycardia, unspecified: Secondary | ICD-10-CM | POA: Diagnosis not present

## 2022-07-14 DIAGNOSIS — Z79899 Other long term (current) drug therapy: Secondary | ICD-10-CM

## 2022-07-14 DIAGNOSIS — F4310 Post-traumatic stress disorder, unspecified: Secondary | ICD-10-CM | POA: Diagnosis present

## 2022-07-14 DIAGNOSIS — F200 Paranoid schizophrenia: Secondary | ICD-10-CM | POA: Diagnosis present

## 2022-07-14 DIAGNOSIS — G929 Unspecified toxic encephalopathy: Secondary | ICD-10-CM | POA: Diagnosis present

## 2022-07-14 DIAGNOSIS — E669 Obesity, unspecified: Secondary | ICD-10-CM | POA: Diagnosis present

## 2022-07-14 DIAGNOSIS — Z6834 Body mass index (BMI) 34.0-34.9, adult: Secondary | ICD-10-CM

## 2022-07-14 DIAGNOSIS — T43621A Poisoning by amphetamines, accidental (unintentional), initial encounter: Secondary | ICD-10-CM | POA: Diagnosis present

## 2022-07-14 LAB — CBC WITH DIFFERENTIAL/PLATELET
Abs Immature Granulocytes: 0.09 10*3/uL — ABNORMAL HIGH (ref 0.00–0.07)
Basophils Absolute: 0.1 10*3/uL (ref 0.0–0.1)
Basophils Relative: 0 %
Eosinophils Absolute: 0 10*3/uL (ref 0.0–0.5)
Eosinophils Relative: 0 %
HCT: 44 % (ref 36.0–46.0)
Hemoglobin: 15.1 g/dL — ABNORMAL HIGH (ref 12.0–15.0)
Immature Granulocytes: 1 %
Lymphocytes Relative: 10 %
Lymphs Abs: 1.9 10*3/uL (ref 0.7–4.0)
MCH: 29.4 pg (ref 26.0–34.0)
MCHC: 34.3 g/dL (ref 30.0–36.0)
MCV: 85.8 fL (ref 80.0–100.0)
Monocytes Absolute: 1.2 10*3/uL — ABNORMAL HIGH (ref 0.1–1.0)
Monocytes Relative: 6 %
Neutro Abs: 15.5 10*3/uL — ABNORMAL HIGH (ref 1.7–7.7)
Neutrophils Relative %: 83 %
Platelets: 371 10*3/uL (ref 150–400)
RBC: 5.13 MIL/uL — ABNORMAL HIGH (ref 3.87–5.11)
RDW: 14.1 % (ref 11.5–15.5)
WBC: 18.7 10*3/uL — ABNORMAL HIGH (ref 4.0–10.5)
nRBC: 0 % (ref 0.0–0.2)

## 2022-07-14 LAB — COMPREHENSIVE METABOLIC PANEL
ALT: 29 U/L (ref 0–44)
AST: 108 U/L — ABNORMAL HIGH (ref 15–41)
Albumin: 5.5 g/dL — ABNORMAL HIGH (ref 3.5–5.0)
Alkaline Phosphatase: 105 U/L (ref 38–126)
Anion gap: 27 — ABNORMAL HIGH (ref 5–15)
BUN: 28 mg/dL — ABNORMAL HIGH (ref 6–20)
CO2: 10 mmol/L — ABNORMAL LOW (ref 22–32)
Calcium: 10.1 mg/dL (ref 8.9–10.3)
Chloride: 98 mmol/L (ref 98–111)
Creatinine, Ser: 1.97 mg/dL — ABNORMAL HIGH (ref 0.44–1.00)
GFR, Estimated: 35 mL/min — ABNORMAL LOW (ref >60)
Glucose, Bld: 148 mg/dL — ABNORMAL HIGH (ref 70–99)
Potassium: 2.9 mmol/L — ABNORMAL LOW (ref 3.5–5.1)
Sodium: 135 mmol/L (ref 135–145)
Total Bilirubin: 1.2 mg/dL (ref 0.3–1.2)
Total Protein: 9.9 g/dL — ABNORMAL HIGH (ref 6.5–8.1)

## 2022-07-14 LAB — ETHANOL: Alcohol, Ethyl (B): <10 mg/dL (ref <10)

## 2022-07-14 LAB — I-STAT BETA HCG BLOOD, ED (MC, WL, AP ONLY): I-stat hCG, quantitative: <5 m[IU]/mL (ref <5)

## 2022-07-14 LAB — CK: Total CK: 7090 U/L — ABNORMAL HIGH (ref 38–234)

## 2022-07-14 LAB — SALICYLATE LEVEL: Salicylate Lvl: <7 mg/dL — ABNORMAL LOW (ref 7.0–30.0)

## 2022-07-14 LAB — ACETAMINOPHEN LEVEL: Acetaminophen (Tylenol), Serum: <10 ug/mL — ABNORMAL LOW (ref 10–30)

## 2022-07-14 MED ORDER — ZIPRASIDONE MESYLATE 20 MG IM SOLR
20.0000 mg | INTRAMUSCULAR | Status: AC | PRN
  Administered 2022-07-14: 20 mg via INTRAMUSCULAR
  Filled 2022-07-14: qty 20

## 2022-07-14 MED ORDER — LACTATED RINGERS IV SOLN: INTRAVENOUS | Status: DC

## 2022-07-14 MED ORDER — STERILE WATER FOR INJECTION IJ SOLN
INTRAMUSCULAR | Status: AC
  Filled 2022-07-14: qty 10

## 2022-07-14 MED ORDER — LACTATED RINGERS IV BOLUS
1000.0000 mL | Freq: Once | INTRAVENOUS | Status: AC
  Administered 2022-07-14: 1000 mL via INTRAVENOUS

## 2022-07-14 MED ORDER — OLANZAPINE 5 MG PO TBDP
5.0000 mg | ORAL_TABLET | Freq: Three times a day (TID) | ORAL | Status: DC | PRN
Start: 2022-07-14 — End: 2022-07-15
  Administered 2022-07-14: 5 mg via ORAL
  Filled 2022-07-14: qty 1

## 2022-07-14 MED ORDER — LORAZEPAM 1 MG PO TABS: 1.0000 mg | ORAL_TABLET | ORAL | Status: DC | PRN

## 2022-07-14 NOTE — ED Triage Notes (Addendum)
Patient BIB [employer]. Patient was found trying to throw herself into traffic. Patient denies that she was doing this and denies SI. Patient's mother reports she could be on ectasy. Patient has hx of Bipolar. Mother reports patient has not been taking medications for "awhile."  Patient not directly answering questions for the nurse. Patient having flight of ideas, delusions, and paranoia. Patient states she is pregnant and also on her period.

## 2022-07-14 NOTE — ED Notes (Signed)
Patient yelling, attempting to run out of room, attempting to hit staff. Security at bedside. Patient restrained to bed and Geodon given. MD made aware.

## 2022-07-14 NOTE — ED Provider Notes (Signed)
EMERGENCY DEPARTMENT AT [facility] Provider Note   CSN: [id number] Arrival date & time: 07/14/22  2133     History  Chief Complaint  Patient presents with   Psychiatric Evaluation    Tracey Lam is a 27 y.o. female.  27 year old female presents with law enforcement due to concern for her safety.  Patient was reportedly running in traffic.  Patient does have a history of bipolar, schizophrenia.  Patient had to be restrained by law enforcement due to her behavior.  Patient reportedly had been abusing methamphetamines and other medications.  Patient is difficult historian at this time and she is in handcuffs.       Home Medications Prior to Admission medications   Medication Sig Start Date End Date Taking? Authorizing Provider  ARIPiprazole (ABILIFY) 15 MG tablet Take 15 mg by mouth in the morning. Patient not taking: Reported on 10/27/2021    [provider]  citalopram (CELEXA) 20 MG tablet Take 40 mg by mouth daily.    [provider]  divalproex (DEPAKOTE ER) 500 MG 24 hr tablet Take 1 tablet (500 mg total) by mouth at bedtime. Patient not taking: Reported on 10/27/2021 09/24/21 12/21/21  Tharon Aquas, NP  OLANZapine (ZYPREXA) 10 MG tablet Take 1 tablet (10 mg total) by mouth at bedtime. Patient not taking: Reported on 10/27/2021 09/24/21 12/21/21  Tharon Aquas, NP  ondansetron (ZOFRAN) 4 MG tablet Take 1 tablet (4 mg total) by mouth every 8 (eight) hours as needed for nausea or vomiting. Patient not taking: Reported on 10/27/2021 09/07/21   Tegeler, Gwenyth Allegra, MD  oxyCODONE-acetaminophen (PERCOCET/ROXICET) 5-325 MG tablet Take 1 tablet by mouth every 4 (four) hours as needed for severe pain. Patient not taking: Reported on 10/27/2021 09/07/21   Tegeler, Gwenyth Allegra, MD  traZODone (DESYREL) 50 MG tablet Take 1 tablet (50 mg total) by mouth at bedtime. Patient taking differently: Take 100 mg by mouth at bedtime. 09/24/21 10/27/21   Tharon Aquas, NP      Allergies    Patient has no known allergies.    Review of Systems   Review of Systems  Unable to perform ROS: Psychiatric disorder    Physical Exam Updated Vital Signs BP 92/60   Pulse 88   Resp 16   Ht 1.473 m (4\' 10" )   Wt 74 kg   BMI 34.10 kg/m  Physical Exam Vitals and nursing note reviewed.  Constitutional:      General: She is not in acute distress.    Appearance: Normal appearance. She is well-developed. She is not toxic-appearing.  HENT:     Head: Normocephalic and atraumatic.  Eyes:     General: Lids are normal.     Conjunctiva/sclera: Conjunctivae normal.     Pupils: Pupils are equal, round, and reactive to light.  Neck:     Thyroid: No thyroid mass.     Trachea: No tracheal deviation.  Cardiovascular:     Rate and Rhythm: Normal rate and regular rhythm.     Heart sounds: Normal heart sounds. No murmur heard.    No gallop.  Pulmonary:     Effort: Pulmonary effort is normal. No respiratory distress.     Breath sounds: Normal breath sounds. No stridor. No decreased breath sounds, wheezing, rhonchi or rales.  Abdominal:     General: There is no distension.     Palpations: Abdomen is soft.     Tenderness: There is no abdominal tenderness. There is no  rebound.  Musculoskeletal:        General: No tenderness. Normal range of motion.     Cervical back: Normal range of motion and neck supple.  Skin:    General: Skin is warm and dry.     Findings: No abrasion or rash.  Neurological:     Mental Status: She is alert and oriented to person, place, and time. Mental status is at baseline.     GCS: GCS eye subscore is 4. GCS verbal subscore is 5. GCS motor subscore is 6.     Cranial Nerves: No cranial nerve deficit.     Sensory: No sensory deficit.     Motor: Motor function is intact.  Psychiatric:        Attention and Perception: Attention normal.        Mood and Affect: Affect is labile and inappropriate.        Speech: Speech is  rapid and pressured.        Behavior: Behavior is agitated and aggressive.     ED Results / Procedures / Treatments   Labs (all labs ordered are listed, but only abnormal results are displayed) Labs Reviewed  CBC WITH DIFFERENTIAL/PLATELET - Abnormal; Notable for the following components:      Result Value   WBC 18.7 (*)    RBC 5.13 (*)    Hemoglobin 15.1 (*)    Neutro Abs 15.5 (*)    Monocytes Absolute 1.2 (*)    Abs Immature Granulocytes 0.09 (*)    All other components within normal limits  COMPREHENSIVE METABOLIC PANEL  RAPID URINE DRUG SCREEN, HOSP PERFORMED  ETHANOL  ACETAMINOPHEN LEVEL  SALICYLATE LEVEL  CK  I-STAT BETA HCG BLOOD, ED ([facility], [facility], [facility] ONLY)    EKG None  Radiology No results found.  Procedures Procedures    Medications Ordered in ED Medications  lactated ringers bolus 1,000 mL (has no administration in time range)  lactated ringers infusion (has no administration in time range)  OLANZapine zydis (ZYPREXA) disintegrating tablet 5 mg (has no administration in time range)    And  LORazepam (ATIVAN) tablet 1 mg (has no administration in time range)    And  ziprasidone (GEODON) injection 20 mg (has no administration in time range)    ED Course/ Medical Decision Making/ A&P                             Medical Decision Making Amount and/or Complexity of Data Reviewed Labs: ordered. ECG/medicine tests: ordered.  Risk Prescription drug management.   Patient placed under IVC due to her behavior and concern for her safety.  Patient became combative once more.  Was given oral antipsychotic for this.  Labs are pending at this time.  Anticipate the patient be medically clear for psychiatric disposition        Final Clinical Impression(s) / ED Diagnoses Final diagnoses:  None    Rx / DC Orders ED Discharge Orders     None         Lacretia Leigh, MD 07/14/22 2241

## 2022-07-14 NOTE — ED Notes (Signed)
Pt changed into burgundy scrubs. Pt has one belonging bag containing her clothing and her cell phone (battery is dead). Belongings located at nurses station in cabinet. [employer] officers remain at bedside.

## 2022-07-15 DIAGNOSIS — G4700 Insomnia, unspecified: Secondary | ICD-10-CM | POA: Diagnosis not present

## 2022-07-15 DIAGNOSIS — R4182 Altered mental status, unspecified: Secondary | ICD-10-CM | POA: Diagnosis not present

## 2022-07-15 DIAGNOSIS — R7401 Elevation of levels of liver transaminase levels: Secondary | ICD-10-CM | POA: Diagnosis not present

## 2022-07-15 DIAGNOSIS — E876 Hypokalemia: Secondary | ICD-10-CM | POA: Diagnosis not present

## 2022-07-15 DIAGNOSIS — F1510 Other stimulant abuse, uncomplicated: Secondary | ICD-10-CM | POA: Diagnosis not present

## 2022-07-15 DIAGNOSIS — M6282 Rhabdomyolysis: Secondary | ICD-10-CM | POA: Diagnosis present

## 2022-07-15 DIAGNOSIS — F3181 Bipolar II disorder: Secondary | ICD-10-CM | POA: Diagnosis not present

## 2022-07-15 DIAGNOSIS — R410 Disorientation, unspecified: Secondary | ICD-10-CM | POA: Diagnosis not present

## 2022-07-15 DIAGNOSIS — Z79899 Other long term (current) drug therapy: Secondary | ICD-10-CM | POA: Diagnosis not present

## 2022-07-15 DIAGNOSIS — Z9151 Personal history of suicidal behavior: Secondary | ICD-10-CM | POA: Diagnosis not present

## 2022-07-15 DIAGNOSIS — F23 Brief psychotic disorder: Secondary | ICD-10-CM

## 2022-07-15 DIAGNOSIS — R45851 Suicidal ideations: Secondary | ICD-10-CM | POA: Diagnosis not present

## 2022-07-15 DIAGNOSIS — E669 Obesity, unspecified: Secondary | ICD-10-CM | POA: Diagnosis not present

## 2022-07-15 DIAGNOSIS — F4310 Post-traumatic stress disorder, unspecified: Secondary | ICD-10-CM | POA: Diagnosis not present

## 2022-07-15 DIAGNOSIS — G929 Unspecified toxic encephalopathy: Secondary | ICD-10-CM | POA: Diagnosis not present

## 2022-07-15 DIAGNOSIS — E860 Dehydration: Secondary | ICD-10-CM | POA: Diagnosis not present

## 2022-07-15 DIAGNOSIS — E872 Acidosis, unspecified: Secondary | ICD-10-CM | POA: Diagnosis not present

## 2022-07-15 DIAGNOSIS — G9340 Encephalopathy, unspecified: Secondary | ICD-10-CM

## 2022-07-15 DIAGNOSIS — Z6834 Body mass index (BMI) 34.0-34.9, adult: Secondary | ICD-10-CM | POA: Diagnosis not present

## 2022-07-15 DIAGNOSIS — J4520 Mild intermittent asthma, uncomplicated: Secondary | ICD-10-CM | POA: Diagnosis not present

## 2022-07-15 DIAGNOSIS — Z87891 Personal history of nicotine dependence: Secondary | ICD-10-CM | POA: Diagnosis not present

## 2022-07-15 DIAGNOSIS — F200 Paranoid schizophrenia: Secondary | ICD-10-CM | POA: Diagnosis not present

## 2022-07-15 DIAGNOSIS — F1914 Other psychoactive substance abuse with psychoactive substance-induced mood disorder: Secondary | ICD-10-CM | POA: Diagnosis not present

## 2022-07-15 DIAGNOSIS — N179 Acute kidney failure, unspecified: Secondary | ICD-10-CM | POA: Diagnosis not present

## 2022-07-15 DIAGNOSIS — T43621A Poisoning by amphetamines, accidental (unintentional), initial encounter: Secondary | ICD-10-CM | POA: Diagnosis not present

## 2022-07-15 LAB — COMPREHENSIVE METABOLIC PANEL
ALT: 26 U/L (ref 0–44)
AST: 82 U/L — ABNORMAL HIGH (ref 15–41)
Albumin: 4.7 g/dL (ref 3.5–5.0)
Alkaline Phosphatase: 81 U/L (ref 38–126)
Anion gap: 17 — ABNORMAL HIGH (ref 5–15)
BUN: 19 mg/dL (ref 6–20)
CO2: 18 mmol/L — ABNORMAL LOW (ref 22–32)
Calcium: 9 mg/dL (ref 8.9–10.3)
Chloride: 100 mmol/L (ref 98–111)
Creatinine, Ser: 1.11 mg/dL — ABNORMAL HIGH (ref 0.44–1.00)
GFR, Estimated: >60 mL/min (ref >60)
Glucose, Bld: 95 mg/dL (ref 70–99)
Potassium: 3.3 mmol/L — ABNORMAL LOW (ref 3.5–5.1)
Sodium: 135 mmol/L (ref 135–145)
Total Bilirubin: 1.7 mg/dL — ABNORMAL HIGH (ref 0.3–1.2)
Total Protein: 8.4 g/dL — ABNORMAL HIGH (ref 6.5–8.1)

## 2022-07-15 LAB — CBC
HCT: 39.3 % (ref 36.0–46.0)
Hemoglobin: 13.2 g/dL (ref 12.0–15.0)
MCH: 29.1 pg (ref 26.0–34.0)
MCHC: 33.6 g/dL (ref 30.0–36.0)
MCV: 86.6 fL (ref 80.0–100.0)
Platelets: 273 10*3/uL (ref 150–400)
RBC: 4.54 MIL/uL (ref 3.87–5.11)
RDW: 14.2 % (ref 11.5–15.5)
WBC: 18.6 10*3/uL — ABNORMAL HIGH (ref 4.0–10.5)
nRBC: 0 % (ref 0.0–0.2)

## 2022-07-15 LAB — LACTIC ACID, PLASMA: Lactic Acid, Venous: 1.5 mmol/L (ref 0.5–1.9)

## 2022-07-15 LAB — RAPID URINE DRUG SCREEN, HOSP PERFORMED
Amphetamines: POSITIVE — AB
Barbiturates: NOT DETECTED
Benzodiazepines: NOT DETECTED
Cocaine: NOT DETECTED
Opiates: NOT DETECTED
Tetrahydrocannabinol: POSITIVE — AB

## 2022-07-15 LAB — BASIC METABOLIC PANEL
Anion gap: 19 — ABNORMAL HIGH (ref 5–15)
BUN: 22 mg/dL — ABNORMAL HIGH (ref 6–20)
CO2: 16 mmol/L — ABNORMAL LOW (ref 22–32)
Calcium: 9.2 mg/dL (ref 8.9–10.3)
Chloride: 101 mmol/L (ref 98–111)
Creatinine, Ser: 1.28 mg/dL — ABNORMAL HIGH (ref 0.44–1.00)
GFR, Estimated: 59 mL/min — ABNORMAL LOW (ref >60)
Glucose, Bld: 94 mg/dL (ref 70–99)
Potassium: 3.1 mmol/L — ABNORMAL LOW (ref 3.5–5.1)
Sodium: 136 mmol/L (ref 135–145)

## 2022-07-15 LAB — CK: Total CK: 5315 U/L — ABNORMAL HIGH (ref 38–234)

## 2022-07-15 LAB — MAGNESIUM: Magnesium: 2.6 mg/dL — ABNORMAL HIGH (ref 1.7–2.4)

## 2022-07-15 MED ORDER — NICOTINE 21 MG/24HR TD PT24
21.0000 mg | MEDICATED_PATCH | Freq: Every day | TRANSDERMAL | Status: DC
  Administered 2022-07-15 – 2022-07-18 (×4): 21 mg via TRANSDERMAL
  Filled 2022-07-15 (×4): qty 1

## 2022-07-15 MED ORDER — LORAZEPAM 2 MG/ML PO CONC: 1.0000 mg | Freq: Four times a day (QID) | ORAL | Status: DC | PRN

## 2022-07-15 MED ORDER — LORAZEPAM 2 MG/ML IJ SOLN
1.0000 mg | Freq: Four times a day (QID) | INTRAMUSCULAR | Status: DC | PRN
  Administered 2022-07-15 – 2022-07-17 (×3): 1 mg via INTRAVENOUS
  Filled 2022-07-15 (×4): qty 1

## 2022-07-15 MED ORDER — POTASSIUM CHLORIDE 10 MEQ/100ML IV SOLN: 10.0000 meq | INTRAVENOUS | Status: DC

## 2022-07-15 MED ORDER — HALOPERIDOL LACTATE 5 MG/ML IJ SOLN
5.0000 mg | Freq: Once | INTRAMUSCULAR | Status: AC | PRN
  Administered 2022-07-15: 5 mg via INTRAVENOUS
  Filled 2022-07-15: qty 1

## 2022-07-15 MED ORDER — POTASSIUM CHLORIDE 10 MEQ/100ML IV SOLN
10.0000 meq | INTRAVENOUS | Status: AC
  Administered 2022-07-15 (×2): 10 meq via INTRAVENOUS
  Filled 2022-07-15 (×2): qty 100

## 2022-07-15 MED ORDER — OLANZAPINE 5 MG PO TBDP
5.0000 mg | ORAL_TABLET | Freq: Every day | ORAL | Status: DC
  Administered 2022-07-15: 5 mg via ORAL
  Filled 2022-07-15: qty 1

## 2022-07-15 NOTE — ED Notes (Signed)
Patient wet the bed. Patient calm and cooperative. Walked to the bathroom, changed clothes, and washed up. Patient bed sheets changed. Patient left out of restraints at this time.

## 2022-07-15 NOTE — ED Notes (Signed)
Patient up to commode

## 2022-07-15 NOTE — ED Notes (Signed)
Pt in bed with eyes closed, pt arouses to verbal stim, pt denies pain, denies si or hi, pt is not restrained at this time, sitter at bedside, pt requests juice/water, drink given, pt oriented to person, place, knows that it is April 2nd 2024 and situation, pt doesn't know day of the week, re oriented pt.

## 2022-07-15 NOTE — Progress Notes (Signed)
PROGRESS NOTE    Tracey Lam  [id number] DOB: [birth year] DOA: 07/14/2022 PCP: Patient, No Pcp Per   Brief Narrative:  Tracey Lam is a 27 y.o. female with medical history significant of schizophrenia, bipolar disorder, depression, asthma, substance abuse brought into the ED by law enforcement due to concern for safety.  She was reportedly running in traffic and had to be restrained by law enforcement due to her behavior and placed under IVC. Hospitalist called to admit due to lab abnormalities/intoxication, psych consulted for further workup/planning in regards to known psychiatric illness.  Assessment & Plan:   Principal Problem:   AMS (altered mental status)  Substance-induced mood disorder/amphetamine abuse History of schizophrenia, bipolar disorder, and depression Acute toxic encephalopathy Questionable suicidal ideation or behavior -Patient presents under IVC with lawn for cement -Psychiatry consulted given her history -appreciate insight and recommendations -Likely to resume antipsychotics per our conversation (tolerated olanzapine, ziprasidone in the ED with markedly improved symptoms). -Per report patient took upwards of 20 tablets, concerning for self-harm  Rhabdomyolysis -Likely secondary to above acute amphetamine toxicity as well as AKI -CK downtrending, continue IV fluids   AKI -In the setting of rhabdomyolysis, acute intoxication and likely poor p.o. intake -Continue IV fluids, follow repeat creatinine, currently downtrending appropriately   Leukocytosis -Likely not infectious in etiology, presumed leukemoid reaction given above/hemoconcentration -Continue to follow CBC; remains afebrile -If signs or symptoms of infection appear would recommend cultures prior to initiation of antibiotics   Hypokalemia In the setting of AKI, follow repeat labs, replete as appropriate.   High anion gap metabolic acidosis Improving, likely secondary to toxic encephalopathy and  potential overdose as well as AKI   Elevated transaminase level Likely due to rhabdomyolysis/aki and dehydration, downtrending appropriately   Mild intermittent asthma Stable, no signs of acute exacerbation.  Does not seem to be on inhalers at home, pharmacy med rec pending.   DVT prophylaxis: SCDs Code Status: Full Code by default.  Patient currently does not have capacity for decision-making, no surrogate or prior directive available. Family Communication: No family available at this time.  Status is: Inpatient  Dispo: The patient is from: Home              Anticipated d/c is to: To be determined              Anticipated d/c date is: 24 to 48 hours              Patient currently not medically stable for discharge given ongoing need for IV fluids, close monitoring in the setting of AKI dehydration questionable suicidal ideation and toxic encephalopathy.  Consultants:  Psychiatry  Procedures:  None  Antimicrobials:  None  Subjective: No acute issues or events overnight, patient much more awake alert and oriented this morning compared to yesterday.  Denies nausea vomiting diarrhea constipation headache fevers chills chest pain shortness of breath  Objective: Vitals:   07/15/22 0230 07/15/22 0600 07/15/22 0740 07/15/22 0744  BP: (!) 135/93 (!) 106/52  109/67  Pulse: 72 68  91  Resp: (!) 23 (!) 25  18  Temp:   98.7 F (37.1 C) 98.7 F (37.1 C)  TempSrc:   Oral Oral  SpO2: 95% 100%  100%  Weight:      Height:        Intake/Output Summary (Last 24 hours) at 07/15/2022 0755 Last data filed at 07/15/2022 0106 Gross per 24 hour  Intake 1000 ml  Output --  Net 1000 ml  Filed Weights   07/14/22 2145  Weight: 74 kg    Examination:  General:  Pleasantly resting in bed, No acute distress. HEENT:  Normocephalic atraumatic.  Sclerae nonicteric, noninjected.  Extraocular movements intact bilaterally. Neck:  Without mass or deformity.  Trachea is midline. Lungs:  Clear to  auscultate bilaterally without rhonchi, wheeze, or rales. Heart:  Regular rate and rhythm.  Without murmurs, rubs, or gallops. Abdomen:  Soft, nontender, nondistended.  Without guarding or rebound. Extremities: Without cyanosis, clubbing, edema, or obvious deformity. Vascular:  Dorsalis pedis and posterior tibial pulses palpable bilaterally. Skin:  Warm and dry, no erythema, no ulcerations.  Data Reviewed: I have personally reviewed following labs and imaging studies  CBC: Recent Labs  Lab 07/14/22 2205 07/15/22 0618  WBC 18.7* 18.6*  NEUTROABS 15.5*  --   HGB 15.1* 13.2  HCT 44.0 39.3  MCV 85.8 86.6  PLT 371 123456   Basic Metabolic Panel: Recent Labs  Lab 07/14/22 2205 07/15/22 0249 07/15/22 0618  NA 135 136 135  K 2.9* 3.1* 3.3*  CL 98 101 100  CO2 10* 16* 18*  GLUCOSE 148* 94 95  BUN 28* 22* 19  CREATININE 1.97* 1.28* 1.11*  CALCIUM 10.1 9.2 9.0  MG  --   --  2.6*   GFR: Estimated Creatinine Clearance: 65.6 mL/min (A) (by C-G formula based on SCr of 1.11 mg/dL (H)). Liver Function Tests: Recent Labs  Lab 07/14/22 2205 07/15/22 0618  AST 108* 82*  ALT 29 26  ALKPHOS 105 81  BILITOT 1.2 1.7*  PROT 9.9* 8.4*  ALBUMIN 5.5* 4.7   No results for input(s): "LIPASE", "AMYLASE" in the last 168 hours. No results for input(s): "AMMONIA" in the last 168 hours. Coagulation Profile: No results for input(s): "INR", "PROTIME" in the last 168 hours. Cardiac Enzymes: Recent Labs  Lab 07/14/22 2208 07/15/22 0618  CKTOTAL 7,090* 5,315*   BNP (last 3 results) No results for input(s): "PROBNP" in the last 8760 hours. HbA1C: No results for input(s): "HGBA1C" in the last 72 hours. CBG: No results for input(s): "GLUCAP" in the last 168 hours. Lipid Profile: No results for input(s): "CHOL", "HDL", "LDLCALC", "TRIG", "CHOLHDL", "LDLDIRECT" in the last 72 hours. Thyroid Function Tests: No results for input(s): "TSH", "T4TOTAL", "FREET4", "T3FREE", "THYROIDAB" in the last  72 hours. Anemia Panel: No results for input(s): "VITAMINB12", "FOLATE", "FERRITIN", "TIBC", "IRON", "RETICCTPCT" in the last 72 hours. Sepsis Labs: Recent Labs  Lab 07/15/22 0138  LATICACIDVEN 1.5    No results found for this or any previous visit (from the past 240 hour(s)).   Radiology Studies: No results found.  Scheduled Meds: Continuous Infusions:  lactated ringers 125 mL/hr at 07/15/22 0106   potassium chloride 50 mL/hr at 07/15/22 0620     LOS: 0 days   Time spent: 77min  Rosella Crandell C Lashay Osborne, DO [facility]  If 7PM-7AM, please contact night-coverage [email or web address]  07/15/2022, 7:55 AM

## 2022-07-15 NOTE — ED Notes (Signed)
ED TO INPATIENT HANDOFF REPORT  Name/Age/Gender Tracey Lam 27 y.o. female  Code Status Code Status History     Date Active Date Inactive Code Status Order ID Comments User Context   10/27/2021 1410 10/29/2021 1727 Full Code [id number]  Reubin Milan, MD ED   09/23/2021 1648 09/24/2021 1733 Full Code [id number]  Consuello Closs, NP ED   12/04/2019 2357 12/06/2019 1031 Full Code [id number]  Caroline Sauger, NP ED       Home/SNF/Other Home  Chief Complaint AMS (altered mental status) [R41.82]  Level of Care/Admitting Diagnosis ED Disposition     ED Disposition  Admit   Condition  --   Comment  Hospital Area: [facility] [[id number]]  Level of Care: Telemetry [5]  Admit to tele based on following criteria: Complex arrhythmia (Bradycardia/Tachycardia)  May place patient in observation at [facility] or [place] if equivalent level of care is available:: Yes  Covid Evaluation: Asymptomatic - no recent exposure (last 10 days) testing not required  Diagnosis: AMS (altered mental status) [id number]  Admitting Physician: Shela Leff [id number]  Attending Physician: Shela Leff [id number]          Medical History Past Medical History:  Diagnosis Date   Asthma     Allergies No Known Allergies  IV Location/Drains/Wounds Patient Lines/Drains/Airways Status     Active Line/Drains/Airways     Name Placement date Placement time Site Days   Peripheral IV 07/14/22 20 G 1" Posterior;Right Hand 07/14/22  2343  Hand  1            Labs/Imaging Results for orders placed or performed during the hospital encounter of 07/14/22 (from the past 48 hour(s))  CBC with Differential/Platelet     Status: Abnormal   Collection Time: 07/14/22 10:05 PM  Result Value Ref Range   WBC 18.7 (H) 4.0 - 10.5 K/uL   RBC 5.13 (H) 3.87 - 5.11 MIL/uL   Hemoglobin 15.1 (H) 12.0 - 15.0 g/dL   HCT 44.0 36.0 - 46.0 %   MCV 85.8 80.0 - 100.0 fL   MCH 29.4 26.0  - 34.0 pg   MCHC 34.3 30.0 - 36.0 g/dL   RDW 14.1 11.5 - 15.5 %   Platelets 371 150 - 400 K/uL   nRBC 0.0 0.0 - 0.2 %   Neutrophils Relative % 83 %   Neutro Abs 15.5 (H) 1.7 - 7.7 K/uL   Lymphocytes Relative 10 %   Lymphs Abs 1.9 0.7 - 4.0 K/uL   Monocytes Relative 6 %   Monocytes Absolute 1.2 (H) 0.1 - 1.0 K/uL   Eosinophils Relative 0 %   Eosinophils Absolute 0.0 0.0 - 0.5 K/uL   Basophils Relative 0 %   Basophils Absolute 0.1 0.0 - 0.1 K/uL   Immature Granulocytes 1 %   Abs Immature Granulocytes 0.09 (H) 0.00 - 0.07 K/uL    Comment: Performed at [facility], [place] [address] [place], [place], [place] [address]  Comprehensive metabolic panel     Status: Abnormal   Collection Time: 07/14/22 10:05 PM  Result Value Ref Range   Sodium 135 135 - 145 mmol/L   Potassium 2.9 (L) 3.5 - 5.1 mmol/L   Chloride 98 98 - 111 mmol/L   CO2 10 (L) 22 - 32 mmol/L   Glucose, Bld 148 (H) 70 - 99 mg/dL    Comment: Glucose reference range applies only to samples taken after fasting for at least 8 hours.   BUN 28 (H) 6 -  20 mg/dL   Creatinine, Ser 1.97 (H) 0.44 - 1.00 mg/dL   Calcium 10.1 8.9 - 10.3 mg/dL   Total Protein 9.9 (H) 6.5 - 8.1 g/dL   Albumin 5.5 (H) 3.5 - 5.0 g/dL   AST 108 (H) 15 - 41 U/L   ALT 29 0 - 44 U/L   Alkaline Phosphatase 105 38 - 126 U/L   Total Bilirubin 1.2 0.3 - 1.2 mg/dL   GFR, Estimated 35 (L) >60 mL/min    Comment: (NOTE) Calculated using the CKD-EPI Creatinine Equation (2021)    Anion gap 27 (H) 5 - 15    Comment: ELECTROLYTES REPEATED TO VERIFY Performed at [place] [address] [place], [place] [address]   Ethanol     Status: None   Collection Time: 07/14/22 10:08 PM  Result Value Ref Range   Alcohol, Ethyl (B) <10 <10 mg/dL    Comment: (NOTE) Lowest detectable limit for serum alcohol is 10 mg/dL.  For medical purposes only. Performed at [facility], [place] [address] [place], [place] [address]    Acetaminophen level     Status: Abnormal   Collection Time: 07/14/22 10:08 PM  Result Value Ref Range   Acetaminophen (Tylenol), Serum <10 (L) 10 - 30 ug/mL    Comment: (NOTE) Therapeutic concentrations vary significantly. A range of 10-30 ug/mL  may be an effective concentration for many patients. However, some  are best treated at concentrations outside of this range. Acetaminophen concentrations >150 ug/mL at 4 hours after ingestion  and >50 ug/mL at 12 hours after ingestion are often associated with  toxic reactions.  Performed at [facility], [place] [address] [place], [place] [id number]   Salicylate level     Status: Abnormal   Collection Time: 07/14/22 10:08 PM  Result Value Ref Range   Salicylate Lvl Q000111Q (L) 7.0 - 30.0 mg/dL    Comment: Performed at [facility], [place] [address] [place],  [address]  CK     Status: Abnormal   Collection Time: 07/14/22 10:08 PM  Result Value Ref Range   Total CK 7,090 (H) 38 - 234 U/L    Comment: RESULT CONFIRMED BY MANUAL DILUTION Performed at [facility], [place] [address] [place],  [address]   I-Stat beta hCG blood, ED ([facility], [facility], [facility] only)     Status: None   Collection Time: 07/14/22 10:13 PM  Result Value Ref Range   I-stat hCG, quantitative <5.0 <5 mIU/mL   Comment 3            Comment:   GEST. AGE      CONC.  (mIU/mL)   <=1 WEEK        5 - 50     2 WEEKS       50 - 500     3 WEEKS       100 - 10,000     4 WEEKS     1,000 - 30,000        FEMALE AND NON-PREGNANT FEMALE:     LESS THAN 5 mIU/mL   Rapid urine drug screen (hospital performed)     Status: Abnormal   Collection Time: 07/15/22  1:21 AM  Result Value Ref Range   Opiates NONE DETECTED NONE DETECTED   Cocaine NONE DETECTED NONE DETECTED   Benzodiazepines NONE DETECTED NONE DETECTED   Amphetamines POSITIVE (A) NONE DETECTED   Tetrahydrocannabinol POSITIVE (A) NONE DETECTED  Barbiturates NONE DETECTED  NONE DETECTED    Comment: (NOTE) DRUG SCREEN FOR MEDICAL PURPOSES ONLY.  IF CONFIRMATION IS NEEDED FOR ANY PURPOSE, NOTIFY LAB WITHIN 5 DAYS.  LOWEST DETECTABLE LIMITS FOR URINE DRUG SCREEN Drug Class                     Cutoff (ng/mL) Amphetamine and metabolites    1000 Barbiturate and metabolites    200 Benzodiazepine                 200 Opiates and metabolites        300 Cocaine and metabolites        300 THC                            50 Performed at [facility], [place] [address], [place], [place] [address]   Lactic acid, plasma     Status: None   Collection Time: 07/15/22  1:38 AM  Result Value Ref Range   Lactic Acid, Venous 1.5 0.5 - 1.9 mmol/L    Comment: Performed at [facility], [place] [address], [place], [place] [id number]  Basic metabolic panel     Status: Abnormal   Collection Time: 07/15/22  2:49 AM  Result Value Ref Range   Sodium 136 135 - 145 mmol/L   Potassium 3.1 (L) 3.5 - 5.1 mmol/L   Chloride 101 98 - 111 mmol/L   CO2 16 (L) 22 - 32 mmol/L   Glucose, Bld 94 70 - 99 mg/dL    Comment: Glucose reference range applies only to samples taken after fasting for at least 8 hours.   BUN 22 (H) 6 - 20 mg/dL   Creatinine, Ser 1.28 (H) 0.44 - 1.00 mg/dL   Calcium 9.2 8.9 - 10.3 mg/dL   GFR, Estimated 59 (L) >60 mL/min    Comment: (NOTE) Calculated using the CKD-EPI Creatinine Equation (2021)    Anion gap 19 (H) 5 - 15    Comment: Performed at [facility], [place] [address], [place], [place] [address]   No results found.  Pending Labs Unresulted Labs (From admission, onward)    None       Vitals/Pain Today's Vitals   07/15/22 0130 07/15/22 0145 07/15/22 0200 07/15/22 0230  BP: 117/71 110/71 105/70 (!) 135/93  Pulse: 87 87 87 72  Resp: (!) 25 20 19  (!) 23  Temp: 98.4 F (36.9 C)     TempSrc: Oral     SpO2: 99% 98% 99% 95%  Weight:      Height:      PainSc:        Isolation Precautions No  active isolations  Medications Medications  lactated ringers infusion ( Intravenous New Bag/Given 07/15/22 0106)  OLANZapine zydis (ZYPREXA) disintegrating tablet 5 mg (5 mg Oral Given 07/14/22 2240)    And  LORazepam (ATIVAN) tablet 1 mg (has no administration in time range)    And  ziprasidone (GEODON) injection 20 mg (20 mg Intramuscular Given 07/14/22 2330)  lactated ringers bolus 1,000 mL (0 mLs Intravenous Stopped 07/15/22 0106)  sterile water (preservative free) injection (  Given 07/14/22 2330)    Mobility Walks with SBA

## 2022-07-15 NOTE — H&P (Signed)
History and Physical    Tracey Lam [id number] DOB: Jul 29, 1995 DOA: 07/14/2022  PCP: Patient, No Pcp Per  Patient coming from: Home  Chief Complaint: Altered mental status  HPI: Tracey Lam is a 27 y.o. female with medical history significant of schizophrenia, bipolar disorder, depression, asthma, substance abuse brought into the ED by law enforcement due to concern for safety.  She was reportedly running in traffic and had to be restrained by law enforcement due to her behavior and placed under IVC.  She was combative on arrival to the ED and initially tachycardic to the 150s (sinus rhythm).  Afebrile.  Initial labs showing WBC 18.7 (hemoglobin and platelet count also above baseline), potassium 2.9, bicarb 10, anion gap 27, glucose 148, BUN 28, creatinine 1.9 (baseline 0.6-0.7), AST 108 and remainder of LFTs normal, blood ethanol level <10, acetaminophen level Q000111Q, salicylate level Q000111Q, CK 7090, beta-hCG negative, UDS positive for amphetamines and THC, lactic acid normal. Patient was given 1 L LR bolus, Zyprexa 5 mg, and Geodon 20 mg.  Tachycardia resolved.  Repeat labs showing potassium 3.1, bicarb 16, anion gap 19, BUN 22, creatinine 1.2.  Patient is currently somnolent after receiving antipsychotics.  Able to wake up briefly and follow commands.  She is oriented to self and able to tell me she is at [facility].  Not able to give any additional history at this time.  Review of Systems:  Review of Systems  Reason unable to perform ROS: Patient somnolent.    Past Medical History:  Diagnosis Date   Asthma     History reviewed. No pertinent surgical history.   reports that she has quit smoking. She has never used smokeless tobacco. She reports current alcohol use. She reports current drug use. Drugs: Cocaine and Marijuana.  No Known Allergies  History reviewed. No pertinent family history.  Prior to Admission medications   Medication Sig Start Date End Date Taking?  Authorizing Provider  ARIPiprazole (ABILIFY) 15 MG tablet Take 15 mg by mouth in the morning. Patient not taking: Reported on 10/27/2021    [provider]  citalopram (CELEXA) 20 MG tablet Take 40 mg by mouth daily.    [provider]  divalproex (DEPAKOTE ER) 500 MG 24 hr tablet Take 1 tablet (500 mg total) by mouth at bedtime. Patient not taking: Reported on 10/27/2021 09/24/21 12/21/21  Tharon Aquas, NP  OLANZapine (ZYPREXA) 10 MG tablet Take 1 tablet (10 mg total) by mouth at bedtime. Patient not taking: Reported on 10/27/2021 09/24/21 12/21/21  Tharon Aquas, NP  ondansetron (ZOFRAN) 4 MG tablet Take 1 tablet (4 mg total) by mouth every 8 (eight) hours as needed for nausea or vomiting. Patient not taking: Reported on 10/27/2021 09/07/21   Tegeler, Gwenyth Allegra, MD  oxyCODONE-acetaminophen (PERCOCET/ROXICET) 5-325 MG tablet Take 1 tablet by mouth every 4 (four) hours as needed for severe pain. Patient not taking: Reported on 10/27/2021 09/07/21   Tegeler, Gwenyth Allegra, MD  traZODone (DESYREL) 50 MG tablet Take 1 tablet (50 mg total) by mouth at bedtime. Patient taking differently: Take 100 mg by mouth at bedtime. 09/24/21 10/27/21  Tharon Aquas, NP    Physical Exam: Vitals:   07/15/22 0130 07/15/22 0145 07/15/22 0200 07/15/22 0230  BP: 117/71 110/71 105/70 (!) 135/93  Pulse: 87 87 87 72  Resp: (!) 25 20 19  (!) 23  Temp: 98.4 F (36.9 C)     TempSrc: Oral     SpO2: 99% 98% 99% 95%  Weight:      Height:        Physical Exam Vitals reviewed.  Constitutional:      General: She is not in acute distress. HENT:     Head: Normocephalic and atraumatic.  Eyes:     Extraocular Movements: Extraocular movements intact.  Cardiovascular:     Rate and Rhythm: Normal rate and regular rhythm.     Pulses: Normal pulses.  Pulmonary:     Effort: Pulmonary effort is normal. No respiratory distress.     Breath sounds: No wheezing or rales.  Abdominal:     General:  Bowel sounds are normal. There is no distension.     Palpations: Abdomen is soft.     Tenderness: There is no abdominal tenderness.  Musculoskeletal:     Cervical back: Normal range of motion.     Right lower leg: No edema.     Left lower leg: No edema.  Skin:    General: Skin is warm and dry.  Neurological:     Mental Status: She is alert.     Comments: Somnolent but arousable Following commands, no focal weakness     Labs on Admission: I have personally reviewed following labs and imaging studies  CBC: Recent Labs  Lab 07/14/22 2205  WBC 18.7*  NEUTROABS 15.5*  HGB 15.1*  HCT 44.0  MCV 85.8  PLT 123456   Basic Metabolic Panel: Recent Labs  Lab 07/14/22 2205 07/15/22 0249  NA 135 136  K 2.9* 3.1*  CL 98 101  CO2 10* 16*  GLUCOSE 148* 94  BUN 28* 22*  CREATININE 1.97* 1.28*  CALCIUM 10.1 9.2   GFR: Estimated Creatinine Clearance: 56.9 mL/min (A) (by C-G formula based on SCr of 1.28 mg/dL (H)). Liver Function Tests: Recent Labs  Lab 07/14/22 2205  AST 108*  ALT 29  ALKPHOS 105  BILITOT 1.2  PROT 9.9*  ALBUMIN 5.5*   No results for input(s): "LIPASE", "AMYLASE" in the last 168 hours. No results for input(s): "AMMONIA" in the last 168 hours. Coagulation Profile: No results for input(s): "INR", "PROTIME" in the last 168 hours. Cardiac Enzymes: Recent Labs  Lab 07/14/22 2208  CKTOTAL 7,090*   BNP (last 3 results) No results for input(s): "PROBNP" in the last 8760 hours. HbA1C: No results for input(s): "HGBA1C" in the last 72 hours. CBG: No results for input(s): "GLUCAP" in the last 168 hours. Lipid Profile: No results for input(s): "CHOL", "HDL", "LDLCALC", "TRIG", "CHOLHDL", "LDLDIRECT" in the last 72 hours. Thyroid Function Tests: No results for input(s): "TSH", "T4TOTAL", "FREET4", "T3FREE", "THYROIDAB" in the last 72 hours. Anemia Panel: No results for input(s): "VITAMINB12", "FOLATE", "FERRITIN", "TIBC", "IRON", "RETICCTPCT" in the last 72  hours. Urine analysis:    Component Value Date/Time   COLORURINE BROWN (A) 09/24/2021 0820   APPEARANCEUR TURBID (A) 09/24/2021 0820   LABSPEC >1.030 (H) 09/24/2021 0820   PHURINE 6.0 09/24/2021 0820   GLUCOSEU NEGATIVE 09/24/2021 0820   HGBUR NEGATIVE 09/24/2021 0820   BILIRUBINUR NEGATIVE 09/24/2021 0820   KETONESUR NEGATIVE 09/24/2021 0820   PROTEINUR 100 (A) 09/24/2021 0820   NITRITE NEGATIVE 09/24/2021 0820   LEUKOCYTESUR NEGATIVE 09/24/2021 0820    Radiological Exams on Admission: No results found.  EKG: Independently reviewed.  Sinus tachycardia.  Assessment and Plan  Substance-induced mood disorder/amphetamine abuse History of schizophrenia, bipolar disorder, depression Patient brought into the ED by law enforcement due to concern for safety.  She was reportedly running in traffic and had to be  restrained by law enforcement due to her behavior and placed under IVC.  She was combative on arrival to the ED and initially tachycardic to the 150s (sinus rhythm).  She was given antipsychotics including Zyprexa and Geodon after which she became calm and currently resting comfortably.  Tachycardia has resolved.  UDS positive for amphetamines and THC.  Psych consult placed.  Rhabdomyolysis Likely due to amphetamine toxicity.  No falls reported and no signs of obvious trauma.  CK 7090.  Labs showing evidence of AKI.  Continue IV fluid hydration and trend CK.  AKI In the setting of rhabdomyolysis.  Creatinine 1.9, baseline 0.6-0.7.  Creatinine improved to 1.2 on repeat labs after IV fluids.  Continue IV fluid hydration and monitor labs.  Avoid nephrotoxic agents.  Leukocytosis ?Reactive vs possible hemoconcentration as hemoglobin and platelet count also above baseline. Patient is afebrile.  Will repeat CBC and if leukocytosis persistent, then pursue further workup including chest x-ray and UA.  Hypokalemia Monitor potassium and magnesium levels, continue to replace as  needed.  High anion gap metabolic acidosis Improved on repeat labs.  No history of diabetes or significant hyperglycemia.  Lactate normal. Blood ethanol level <10, acetaminophen level Q000111Q, salicylate level Q000111Q.  Continue IV fluid hydration and monitor labs.  Elevated transaminase level Likely due to rhabdomyolysis.  Continue to monitor LFTs and avoid hepatotoxic agents.  Mild intermittent asthma Stable, no signs of acute exacerbation.  Does not seem to be on inhalers at home, pharmacy med rec pending.  DVT prophylaxis: SCDs Code Status: Full Code by default.  Patient currently does not have capacity for decision-making, no surrogate or prior directive available. Family Communication: No family available at this time. Level of care: Telemetry bed Admission status: It is my clinical opinion that referral for OBSERVATION is reasonable and necessary in this patient based on the above information provided. The aforementioned taken together are felt to place the patient at high risk for further clinical deterioration. However, it is anticipated that the patient may be medically stable for discharge from the hospital within 24 to 48 hours.   Shela Leff MD [facility]  If 7PM-7AM, please contact night-coverage [email or web address]  07/15/2022, 4:17 AM

## 2022-07-15 NOTE — ED Notes (Signed)
ED TO INPATIENT HANDOFF REPORT  ED Nurse Name and Phone #: Salvatore Decent Name/Age/Gender Tracey Lam 27 y.o. female Room/Bed: WA16/WA16  Code Status   Code Status: Full Code  Home/SNF/Other Home Patient oriented to: self, place, time, and situation Is this baseline? Yes   Triage Complete: Triage complete  Chief Complaint AMS (altered mental status) [R41.82]  Triage Note Patient BIB [facility]. Patient was found trying to throw herself into traffic. Patient denies that she was doing this and denies SI. Patient's mother reports she could be on ectasy. Patient has hx of Bipolar. Mother reports patient has not been taking medications for "awhile."  Patient not directly answering questions for the nurse. Patient having flight of ideas, delusions, and paranoia. Patient states she is pregnant and also on her period.    Allergies No Known Allergies  Level of Care/Admitting Diagnosis ED Disposition     ED Disposition  Admit   Condition  --   Comment  Hospital Area: [facility] [[id number]]  Level of Care: Telemetry [5]  Admit to tele based on following criteria: Complex arrhythmia (Bradycardia/Tachycardia)  May place patient in observation at [facility] or [place] if equivalent level of care is available:: Yes  Covid Evaluation: Asymptomatic - no recent exposure (last 10 days) testing not required  Diagnosis: AMS (altered mental status) [id number]  Admitting Physician: Shela Leff [id number]  Attending Physician: Shela Leff [id number]          B Medical/Surgery History Past Medical History:  Diagnosis Date   Asthma    History reviewed. No pertinent surgical history.   A IV Location/Drains/Wounds Patient Lines/Drains/Airways Status     Active Line/Drains/Airways     Name Placement date Placement time Site Days   Peripheral IV 07/15/22 22 G 1" Posterior;Right Hand 07/15/22  0509  Hand  less than 1            Intake/Output Last 24  hours  Intake/Output Summary (Last 24 hours) at 07/15/2022 0746 Last data filed at 07/15/2022 0106 Gross per 24 hour  Intake 1000 ml  Output --  Net 1000 ml    Labs/Imaging Results for orders placed or performed during the hospital encounter of 07/14/22 (from the past 48 hour(s))  CBC with Differential/Platelet     Status: Abnormal   Collection Time: 07/14/22 10:05 PM  Result Value Ref Range   WBC 18.7 (H) 4.0 - 10.5 K/uL   RBC 5.13 (H) 3.87 - 5.11 MIL/uL   Hemoglobin 15.1 (H) 12.0 - 15.0 g/dL   HCT 44.0 36.0 - 46.0 %   MCV 85.8 80.0 - 100.0 fL   MCH 29.4 26.0 - 34.0 pg   MCHC 34.3 30.0 - 36.0 g/dL   RDW 14.1 11.5 - 15.5 %   Platelets 371 150 - 400 K/uL   nRBC 0.0 0.0 - 0.2 %   Neutrophils Relative % 83 %   Neutro Abs 15.5 (H) 1.7 - 7.7 K/uL   Lymphocytes Relative 10 %   Lymphs Abs 1.9 0.7 - 4.0 K/uL   Monocytes Relative 6 %   Monocytes Absolute 1.2 (H) 0.1 - 1.0 K/uL   Eosinophils Relative 0 %   Eosinophils Absolute 0.0 0.0 - 0.5 K/uL   Basophils Relative 0 %   Basophils Absolute 0.1 0.0 - 0.1 K/uL   Immature Granulocytes 1 %   Abs Immature Granulocytes 0.09 (H) 0.00 - 0.07 K/uL    Comment: Performed at [facility], [address] [place],  [place] [address]  Comprehensive metabolic panel     Status: Abnormal   Collection Time: 07/14/22 10:05 PM  Result Value Ref Range   Sodium 135 135 - 145 mmol/L   Potassium 2.9 (L) 3.5 - 5.1 mmol/L   Chloride 98 98 - 111 mmol/L   CO2 10 (L) 22 - 32 mmol/L   Glucose, Bld 148 (H) 70 - 99 mg/dL    Comment: Glucose reference range applies only to samples taken after fasting for at least 8 hours.   BUN 28 (H) 6 - 20 mg/dL   Creatinine, Ser 1.97 (H) 0.44 - 1.00 mg/dL   Calcium 10.1 8.9 - 10.3 mg/dL   Total Protein 9.9 (H) 6.5 - 8.1 g/dL   Albumin 5.5 (H) 3.5 - 5.0 g/dL   AST 108 (H) 15 - 41 U/L   ALT 29 0 - 44 U/L   Alkaline Phosphatase 105 38 - 126 U/L   Total Bilirubin 1.2 0.3 - 1.2 mg/dL   GFR, Estimated 35 (L)  >60 mL/min    Comment: (NOTE) Calculated using the CKD-EPI Creatinine Equation (2021)    Anion gap 27 (H) 5 - 15    Comment: ELECTROLYTES REPEATED TO VERIFY Performed at [facility] [address] [place], [place] [address]   Ethanol     Status: None   Collection Time: 07/14/22 10:08 PM  Result Value Ref Range   Alcohol, Ethyl (B) <10 <10 mg/dL    Comment: (NOTE) Lowest detectable limit for serum alcohol is 10 mg/dL.  For medical purposes only. Performed at [facility], [place] [address], [place], [place] [address]   Acetaminophen level     Status: Abnormal   Collection Time: 07/14/22 10:08 PM  Result Value Ref Range   Acetaminophen (Tylenol), Serum <10 (L) 10 - 30 ug/mL    Comment: (NOTE) Therapeutic concentrations vary significantly. A range of 10-30 ug/mL  may be an effective concentration for many patients. However, some  are best treated at concentrations outside of this range. Acetaminophen concentrations >150 ug/mL at 4 hours after ingestion  and >50 ug/mL at 12 hours after ingestion are often associated with  toxic reactions.  Performed at [facility], [place] [address] [place], [place] [id number]   Salicylate level     Status: Abnormal   Collection Time: 07/14/22 10:08 PM  Result Value Ref Range   Salicylate Lvl [id number] (L) 7.0 - 30.0 mg/dL    Comment: Performed at [facility], [place] [address], [place], [place] [address]  CK     Status: Abnormal   Collection Time: 07/14/22 10:08 PM  Result Value Ref Range   Total CK 7,090 (H) 38 - 234 U/L    Comment: RESULT CONFIRMED BY MANUAL DILUTION Performed at [facility], [place] [address], [place],  [address]   I-Stat beta hCG blood, ED ([facility], [facility], [facility] only)     Status: None   Collection Time: 07/14/22 10:13 PM  Result Value Ref Range   I-stat hCG, quantitative <5.0 <5 mIU/mL   Comment 3            Comment:   GEST. AGE       CONC.  (mIU/mL)   <=1 WEEK        5 - 50     2 WEEKS       50 - 500     3 WEEKS       100 - 10,000  4 WEEKS     1,000 - 30,000        FEMALE AND NON-PREGNANT FEMALE:     LESS THAN 5 mIU/mL   Rapid urine drug screen (hospital performed)     Status: Abnormal   Collection Time: 07/15/22  1:21 AM  Result Value Ref Range   Opiates NONE DETECTED NONE DETECTED   Cocaine NONE DETECTED NONE DETECTED   Benzodiazepines NONE DETECTED NONE DETECTED   Amphetamines POSITIVE (A) NONE DETECTED   Tetrahydrocannabinol POSITIVE (A) NONE DETECTED   Barbiturates NONE DETECTED NONE DETECTED    Comment: (NOTE) DRUG SCREEN FOR MEDICAL PURPOSES ONLY.  IF CONFIRMATION IS NEEDED FOR ANY PURPOSE, NOTIFY LAB WITHIN 5 DAYS.  LOWEST DETECTABLE LIMITS FOR URINE DRUG SCREEN Drug Class                     Cutoff (ng/mL) Amphetamine and metabolites    1000 Barbiturate and metabolites    200 Benzodiazepine                 200 Opiates and metabolites        300 Cocaine and metabolites        300 THC                            50 Performed at [facility], [place] [address] [place], [place] [address]   Lactic acid, plasma     Status: None   Collection Time: 07/15/22  1:38 AM  Result Value Ref Range   Lactic Acid, Venous 1.5 0.5 - 1.9 mmol/L    Comment: Performed at [facility], [place] [address], [place], [place] [id number]  Basic metabolic panel     Status: Abnormal   Collection Time: 07/15/22  2:49 AM  Result Value Ref Range   Sodium 136 135 - 145 mmol/L   Potassium 3.1 (L) 3.5 - 5.1 mmol/L   Chloride 101 98 - 111 mmol/L   CO2 16 (L) 22 - 32 mmol/L   Glucose, Bld 94 70 - 99 mg/dL    Comment: Glucose reference range applies only to samples taken after fasting for at least 8 hours.   BUN 22 (H) 6 - 20 mg/dL   Creatinine, Ser 1.28 (H) 0.44 - 1.00 mg/dL   Calcium 9.2 8.9 - 10.3 mg/dL   GFR, Estimated 59 (L) >60 mL/min    Comment: (NOTE) Calculated using the CKD-EPI  Creatinine Equation (2021)    Anion gap 19 (H) 5 - 15    Comment: Performed at [facility], [place] [address], [place], [place] [address]  CBC     Status: Abnormal   Collection Time: 07/15/22  6:18 AM  Result Value Ref Range   WBC 18.6 (H) 4.0 - 10.5 K/uL   RBC 4.54 3.87 - 5.11 MIL/uL   Hemoglobin 13.2 12.0 - 15.0 g/dL   HCT 39.3 36.0 - 46.0 %   MCV 86.6 80.0 - 100.0 fL   MCH 29.1 26.0 - 34.0 pg   MCHC 33.6 30.0 - 36.0 g/dL   RDW 14.2 11.5 - 15.5 %   Platelets 273 150 - 400 K/uL   nRBC 0.0 0.0 - 0.2 %    Comment: Performed at [facility], [place] [address] [place], [place] [address]  Comprehensive metabolic panel     Status: Abnormal   Collection Time: 07/15/22  6:18 AM  Result Value  Ref Range   Sodium 135 135 - 145 mmol/L   Potassium 3.3 (L) 3.5 - 5.1 mmol/L   Chloride 100 98 - 111 mmol/L   CO2 18 (L) 22 - 32 mmol/L   Glucose, Bld 95 70 - 99 mg/dL    Comment: Glucose reference range applies only to samples taken after fasting for at least 8 hours.   BUN 19 6 - 20 mg/dL   Creatinine, Ser 1.11 (H) 0.44 - 1.00 mg/dL   Calcium 9.0 8.9 - 10.3 mg/dL   Total Protein 8.4 (H) 6.5 - 8.1 g/dL   Albumin 4.7 3.5 - 5.0 g/dL   AST 82 (H) 15 - 41 U/L   ALT 26 0 - 44 U/L   Alkaline Phosphatase 81 38 - 126 U/L   Total Bilirubin 1.7 (H) 0.3 - 1.2 mg/dL   GFR, Estimated >60 >60 mL/min    Comment: (NOTE) Calculated using the CKD-EPI Creatinine Equation (2021)    Anion gap 17 (H) 5 - 15    Comment: Performed at [facility], [place] [address], [place], [place] [address]  CK     Status: Abnormal   Collection Time: 07/15/22  6:18 AM  Result Value Ref Range   Total CK 5,315 (H) 38 - 234 U/L    Comment: RESULT CONFIRMED BY MANUAL DILUTION Performed at [facility], [place] [address] [place], [place] [address]   Magnesium     Status: Abnormal   Collection Time: 07/15/22  6:18 AM  Result Value Ref Range   Magnesium 2.6 (H)  1.7 - 2.4 mg/dL    Comment: Performed at [facility], [place] [address], [place], [place] [address]   No results found.  Pending Labs Unresulted Labs (From admission, onward)    None       Vitals/Pain Today's Vitals   07/15/22 0230 07/15/22 0600 07/15/22 0740 07/15/22 0744  BP: (!) 135/93 (!) 106/52  109/67  Pulse: 72 68  91  Resp: (!) 23 (!) 25  18  Temp:   98.7 F (37.1 C) 98.7 F (37.1 C)  TempSrc:   Oral Oral  SpO2: 95% 100%  100%  Weight:      Height:      PainSc:    0-No pain    Isolation Precautions No active isolations  Medications Medications  lactated ringers infusion ( Intravenous New Bag/Given 07/15/22 0106)  potassium chloride 10 mEq in 100 mL IVPB ( Intravenous Rate/Dose Change 07/15/22 0620)  lactated ringers bolus 1,000 mL (0 mLs Intravenous Stopped 07/15/22 0106)  ziprasidone (GEODON) injection 20 mg (20 mg Intramuscular Given 07/14/22 2330)  sterile water (preservative free) injection (  Given 07/14/22 2330)    Mobility walks         R Recommendations: See Admitting Provider Note  Report given to:   Additional Notes: has sitter

## 2022-07-15 NOTE — ED Notes (Signed)
This RN requested a Actuary. Supervisor unable to provide 1:1 sitter at this time.

## 2022-07-15 NOTE — Consult Note (Signed)
[place] Psychiatry Consult   Reason for Consult:  SI, psychosis after using hallucinogen Referring Physician:  Dr. Olevia Bowens Patient Identification: Tracey Lam MRN:  [id number] Principal Diagnosis: AMS (altered mental status) Diagnosis:  Principal Problem:   AMS (altered mental status) Active Problems:   Rhabdomyolysis   Total Time spent with patient: 1 hour  Subjective:   Tracey Lam is a 27 y.o. 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 She states she has been using THC but only "ectasy does this to me. I did not plan on using them, but to take 1-2 put the rest away and then sell some. But I just kept taking them and here I am. " She regrets her relapse at this time, and appears remorseful. She also reports taking her psych medication " can't remember the name of it" and was stable prior to this admission.   Patient is calm and cooperative, exhibiting no signs of acute distress this morning. She reports feeling "tired" .The patient acknowledges the use of xcstacy and methamphetamines, which often lead to psychotic symptoms. She is observed to be looking around the room, before she began to use her right hand to swat at things. Patient asked was she seeing things in which she states yes. She does not feel like she is a danger to herself citing " this has happened before." She agrees to Teacher, adult education as she is acutely psychotic and unpredictable at this time. She denies any si/hi at this time.   HPI: Tracey Lam is a 27 y.o. female with medical history significant of schizophrenia, bipolar disorder, depression, asthma, substance abuse brought into the ED by law enforcement due to concern for safety.  She was reportedly running in traffic and had to be restrained by law enforcement due to her behavior and placed under IVC. Hospitalist called to admit due to lab abnormalities/intoxication, psych consulted  for further workup/planning in regards to known psychiatric illness.   Past Psychiatric History:  Pt w/ reported hx of "paranoid schizophrenia. Chart review shows history of Bipolar II, PTSD 2/t IPV, One history of suicide attempt by overdose, following a miscarriage.   Risk to Self:   Denies Risk to Others:   Denies Prior Inpatient Therapy:  Multiple more than 10x. [place], [place], [facility] care system, rehabilitation Prior Outpatient Therapy:   [place]  Past Medical History:  Past Medical History:  Diagnosis Date   Asthma    History reviewed. No pertinent surgical history. Family History: History reviewed. No pertinent family history. Family Psychiatric  History: Pt denies family psychiatric hx Social History:  Social History   Substance and Sexual Activity  Alcohol Use Yes   Comment: Occassional      Social History   Substance and Sexual Activity  Drug Use Yes   Types: Cocaine, Marijuana   Comment: pt reports she used last night     Social History   Socioeconomic History   Marital status: Single    Spouse name: Not on file   Number of children: Not on file   Years of education: Not on file   Highest education level: Not on file  Occupational History   Not on file  Tobacco Use   Smoking status: Former   Smokeless tobacco: Never  Substance and Sexual Activity   Alcohol use: Yes    Comment: Occassional    Drug use: Yes    Types: Cocaine, Marijuana  Comment: pt reports she used last night    Sexual activity: Yes  Other Topics Concern   Not on file  Social History Narrative   Not on file   Social Determinants of Health   Financial Resource Strain: Not on file  Food Insecurity: Not on file  Transportation Needs: Not on file  Physical Activity: Not on file  Stress: Not on file  Social Connections: Not on file   Additional Social History:    Allergies:  No Known Allergies  Labs:  Results for orders placed or performed during the hospital encounter  of 07/14/22 (from the past 48 hour(s))  CBC with Differential/Platelet     Status: Abnormal   Collection Time: 07/14/22 10:05 PM  Result Value Ref Range   WBC 18.7 (H) 4.0 - 10.5 K/uL   RBC 5.13 (H) 3.87 - 5.11 MIL/uL   Hemoglobin 15.1 (H) 12.0 - 15.0 g/dL   HCT 44.0 36.0 - 46.0 %   MCV 85.8 80.0 - 100.0 fL   MCH 29.4 26.0 - 34.0 pg   MCHC 34.3 30.0 - 36.0 g/dL   RDW 14.1 11.5 - 15.5 %   Platelets 371 150 - 400 K/uL   nRBC 0.0 0.0 - 0.2 %   Neutrophils Relative % 83 %   Neutro Abs 15.5 (H) 1.7 - 7.7 K/uL   Lymphocytes Relative 10 %   Lymphs Abs 1.9 0.7 - 4.0 K/uL   Monocytes Relative 6 %   Monocytes Absolute 1.2 (H) 0.1 - 1.0 K/uL   Eosinophils Relative 0 %   Eosinophils Absolute 0.0 0.0 - 0.5 K/uL   Basophils Relative 0 %   Basophils Absolute 0.1 0.0 - 0.1 K/uL   Immature Granulocytes 1 %   Abs Immature Granulocytes 0.09 (H) 0.00 - 0.07 K/uL    Comment: Performed at [facility], [place] [address], [place], [place] [address]  Comprehensive metabolic panel     Status: Abnormal   Collection Time: 07/14/22 10:05 PM  Result Value Ref Range   Sodium 135 135 - 145 mmol/L   Potassium 2.9 (L) 3.5 - 5.1 mmol/L   Chloride 98 98 - 111 mmol/L   CO2 10 (L) 22 - 32 mmol/L   Glucose, Bld 148 (H) 70 - 99 mg/dL    Comment: Glucose reference range applies only to samples taken after fasting for at least 8 hours.   BUN 28 (H) 6 - 20 mg/dL   Creatinine, Ser 1.97 (H) 0.44 - 1.00 mg/dL   Calcium 10.1 8.9 - 10.3 mg/dL   Total Protein 9.9 (H) 6.5 - 8.1 g/dL   Albumin 5.5 (H) 3.5 - 5.0 g/dL   AST 108 (H) 15 - 41 U/L   ALT 29 0 - 44 U/L   Alkaline Phosphatase 105 38 - 126 U/L   Total Bilirubin 1.2 0.3 - 1.2 mg/dL   GFR, Estimated 35 (L) >60 mL/min    Comment: (NOTE) Calculated using the CKD-EPI Creatinine Equation (2021)    Anion gap 27 (H) 5 - 15    Comment: ELECTROLYTES REPEATED TO VERIFY Performed at [place] [address] [place], [place]  [address]   Ethanol     Status: None   Collection Time: 07/14/22 10:08 PM  Result Value Ref Range   Alcohol, Ethyl (B) <10 <10 mg/dL    Comment: (NOTE) Lowest detectable limit for serum alcohol is 10 mg/dL.  For medical purposes only. Performed at [facility], [place]  [address] [place], [place] [address]   Acetaminophen level     Status: Abnormal   Collection Time: 07/14/22 10:08 PM  Result Value Ref Range   Acetaminophen (Tylenol), Serum <10 (L) 10 - 30 ug/mL    Comment: (NOTE) Therapeutic concentrations vary significantly. A range of 10-30 ug/mL  may be an effective concentration for many patients. However, some  are best treated at concentrations outside of this range. Acetaminophen concentrations >150 ug/mL at 4 hours after ingestion  and >50 ug/mL at 12 hours after ingestion are often associated with  toxic reactions.  Performed at [facility], [place] [address], [place], [place] [id number]   Salicylate level     Status: Abnormal   Collection Time: 07/14/22 10:08 PM  Result Value Ref Range   Salicylate Lvl [id number] (L) 7.0 - 30.0 mg/dL    Comment: Performed at [facility], [place] [address] [place], [place] [address]  CK     Status: Abnormal   Collection Time: 07/14/22 10:08 PM  Result Value Ref Range   Total CK 7,090 (H) 38 - 234 U/L    Comment: RESULT CONFIRMED BY MANUAL DILUTION Performed at [facility], [place] [address], [place], [place] [address]   I-Stat beta hCG blood, ED ([facility], [facility], [facility] only)     Status: None   Collection Time: 07/14/22 10:13 PM  Result Value Ref Range   I-stat hCG, quantitative <5.0 <5 mIU/mL   Comment 3            Comment:   GEST. AGE      CONC.  (mIU/mL)   <=1 WEEK        5 - 50     2 WEEKS       50 - 500     3 WEEKS       100 - 10,000     4 WEEKS     1,000 - 30,000        FEMALE AND NON-PREGNANT FEMALE:     LESS THAN 5 mIU/mL   Rapid urine drug screen (hospital performed)      Status: Abnormal   Collection Time: 07/15/22  1:21 AM  Result Value Ref Range   Opiates NONE DETECTED NONE DETECTED   Cocaine NONE DETECTED NONE DETECTED   Benzodiazepines NONE DETECTED NONE DETECTED   Amphetamines POSITIVE (A) NONE DETECTED   Tetrahydrocannabinol POSITIVE (A) NONE DETECTED   Barbiturates NONE DETECTED NONE DETECTED    Comment: (NOTE) DRUG SCREEN FOR MEDICAL PURPOSES ONLY.  IF CONFIRMATION IS NEEDED FOR ANY PURPOSE, NOTIFY LAB WITHIN 5 DAYS.  LOWEST DETECTABLE LIMITS FOR URINE DRUG SCREEN Drug Class                     Cutoff (ng/mL) Amphetamine and metabolites    1000 Barbiturate and metabolites    200 Benzodiazepine                 200 Opiates and metabolites        300 Cocaine and metabolites        300 THC                            50 Performed at [facility], [place] [address] [place], [place] [address]   Lactic acid, plasma     Status: None   Collection Time: 07/15/22  1:38 AM  Result Value Ref Range  Lactic Acid, Venous 1.5 0.5 - 1.9 mmol/L    Comment: Performed at [facility], [place] [address] [place], [place] [id number]  Basic metabolic panel     Status: Abnormal   Collection Time: 07/15/22  2:49 AM  Result Value Ref Range   Sodium 136 135 - 145 mmol/L   Potassium 3.1 (L) 3.5 - 5.1 mmol/L   Chloride 101 98 - 111 mmol/L   CO2 16 (L) 22 - 32 mmol/L   Glucose, Bld 94 70 - 99 mg/dL    Comment: Glucose reference range applies only to samples taken after fasting for at least 8 hours.   BUN 22 (H) 6 - 20 mg/dL   Creatinine, Ser 1.28 (H) 0.44 - 1.00 mg/dL   Calcium 9.2 8.9 - 10.3 mg/dL   GFR, Estimated 59 (L) >60 mL/min    Comment: (NOTE) Calculated using the CKD-EPI Creatinine Equation (2021)    Anion gap 19 (H) 5 - 15    Comment: Performed at [facility], [place] [address], [place], [place] [address]  CBC     Status: Abnormal   Collection Time: 07/15/22  6:18 AM  Result Value Ref Range    WBC 18.6 (H) 4.0 - 10.5 K/uL   RBC 4.54 3.87 - 5.11 MIL/uL   Hemoglobin 13.2 12.0 - 15.0 g/dL   HCT 39.3 36.0 - 46.0 %   MCV 86.6 80.0 - 100.0 fL   MCH 29.1 26.0 - 34.0 pg   MCHC 33.6 30.0 - 36.0 g/dL   RDW 14.2 11.5 - 15.5 %   Platelets 273 150 - 400 K/uL   nRBC 0.0 0.0 - 0.2 %    Comment: Performed at [facility], [place] [address] [place], [place] [address]  Comprehensive metabolic panel     Status: Abnormal   Collection Time: 07/15/22  6:18 AM  Result Value Ref Range   Sodium 135 135 - 145 mmol/L   Potassium 3.3 (L) 3.5 - 5.1 mmol/L   Chloride 100 98 - 111 mmol/L   CO2 18 (L) 22 - 32 mmol/L   Glucose, Bld 95 70 - 99 mg/dL    Comment: Glucose reference range applies only to samples taken after fasting for at least 8 hours.   BUN 19 6 - 20 mg/dL   Creatinine, Ser 1.11 (H) 0.44 - 1.00 mg/dL   Calcium 9.0 8.9 - 10.3 mg/dL   Total Protein 8.4 (H) 6.5 - 8.1 g/dL   Albumin 4.7 3.5 - 5.0 g/dL   AST 82 (H) 15 - 41 U/L   ALT 26 0 - 44 U/L   Alkaline Phosphatase 81 38 - 126 U/L   Total Bilirubin 1.7 (H) 0.3 - 1.2 mg/dL   GFR, Estimated >60 >60 mL/min    Comment: (NOTE) Calculated using the CKD-EPI Creatinine Equation (2021)    Anion gap 17 (H) 5 - 15    Comment: Performed at [facility], [place] [address], [place], [place] [address]  CK     Status: Abnormal   Collection Time: 07/15/22  6:18 AM  Result Value Ref Range   Total CK 5,315 (H) 38 - 234 U/L    Comment: RESULT CONFIRMED BY MANUAL DILUTION Performed at [facility], [place] [address] [place],  [address]   Magnesium     Status: Abnormal   Collection Time: 07/15/22  6:18 AM  Result Value Ref Range   Magnesium 2.6 (H) 1.7 - 2.4 mg/dL  Comment: Performed at [facility], [place] [address], [place], [place] [address]    Current Facility-Administered Medications  Medication Dose Route Frequency Provider Last Rate Last Admin   lactated ringers  infusion   Intravenous Continuous Shela Leff, MD 125 mL/hr at 07/15/22 1633 New Bag at 07/15/22 1633   LORazepam (ATIVAN) injection 1 mg  1 mg Intravenous Q6H PRN Suella Broad, FNP   1 mg at 07/15/22 1425    Musculoskeletal: Strength & Muscle Tone: within normal limits Gait & Station: normal Patient leans: N/A   Psychiatric Specialty Exam:  Presentation  General Appearance: Disheveled  Eye Contact:Fair  Speech:Clear and Coherent; Normal Rate  Speech Volume:Normal  Handedness:Right   Mood and Affect  Mood:Euphoric  Affect:Blunt; Full Range   Thought Process  Thought Processes:Irrevelant; Linear  Descriptions of Associations:Tangential  Orientation:Full (Time, Place and Person)  Thought Content:Rumination; Tangential; Logical  History of Schizophrenia/Schizoaffective disorder:Yes  Duration of Psychotic Symptoms:N/A  Hallucinations:Hallucinations: Auditory; Visual Description of Auditory Hallucinations: UNABLE TO DESCRIBE Description of Visual Hallucinations: UNABLE TO DESCRIBE  Ideas of Reference:None  Suicidal Thoughts:Suicidal Thoughts: No  Homicidal Thoughts:Homicidal Thoughts: No   Sensorium  Memory:Immediate Poor; Recent Fair; Remote Poor  Judgment:Poor  Insight:Lacking   Executive Functions  Concentration:Fair  Attention Span:Fair  [place]   Psychomotor Activity  Psychomotor Activity:Psychomotor Activity: Restlessness   Assets  Assets:Communication Skills; Desire for Improvement; Financial Resources/Insurance; Social Support; Leisure Time; Resilience   Sleep  Sleep:Sleep: Fair   Physical Exam: Physical Exam Vitals and nursing note reviewed.  Constitutional:      General: She is sleeping.     Appearance: She is obese.  Neurological:     General: No focal deficit present.     Mental Status: She is oriented to person, place, and time and easily aroused. Mental  status is at baseline.  Psychiatric:        Attention and Perception: Attention normal. She perceives visual hallucinations.        Mood and Affect: Mood is elated.        Speech: Speech is rapid and pressured and tangential.        Behavior: Behavior normal. Behavior is cooperative.        Thought Content: Thought content normal.        Judgment: Judgment normal.    Review of Systems  Constitutional:  Positive for diaphoresis.  All other systems reviewed and are negative.  Blood pressure 131/62, pulse 67, temperature 98.6 F (37 C), temperature source Oral, resp. rate 20, height 4\' 10"  (1.473 m), weight 74 kg, SpO2 98 %. Body mass index is 34.1 kg/m.  Patient reports relapse in psychosis after use of (20+) MDMA pills. She reports doing well;while compliant with her medications. She reports being disappointed but didn't anticipate using them all as she planned to sell some. 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 At this time she is responding to internal stimuli and noted to be swatting at things and eyes darting around the room. She reports visual hallucinations as well. She denies any suicide attempt or intentions with taking over 20 pills of MDMA.   In regards to her auditory hallucinations, these arose in the setting of substance use and are intermittent, which aligns with a substance-induced psychosis rather than a primary thought disorder. She is in rhabdomyolysis (CK >5000), that is expected to trend down. Will continue to reassess daily the need for inpatient  psychiatry and suicide intent.   Treatment Plan Summary: Medication management and Plan WIll resume home medications at this time.  -Refrain from use of illicit substances that can increase or worsen psychosis.  -Continue IVC at this time.  -Will start olanzapine 5mg  po qhs.  Will start Ativan 1mg  po/iv q6hr prn for agitation.  Repeat CK levels tomorrow to ensure trending down, suspect this  is 2/t significant use of MDMA and possible restraints     Disposition:  Final disposition pending. Will need to assess when not under the influence of MDMA and psychosis. She continues to meet criteria for IVC at this time.   Suella Broad, FNP 07/15/2022 4:48 PM

## 2022-07-15 NOTE — ED Notes (Signed)
Patient attempting to crawl out of bed. Patient taken to the bathroom, was incontinent of bladder, pants changed, bed linens changed, and depend put on. Patient is back in bed resting comfortably.

## 2022-07-16 DIAGNOSIS — G9340 Encephalopathy, unspecified: Secondary | ICD-10-CM | POA: Diagnosis not present

## 2022-07-16 LAB — COMPREHENSIVE METABOLIC PANEL
ALT: 22 U/L (ref 0–44)
AST: 65 U/L — ABNORMAL HIGH (ref 15–41)
Albumin: 3.4 g/dL — ABNORMAL LOW (ref 3.5–5.0)
Alkaline Phosphatase: 58 U/L (ref 38–126)
Anion gap: 7 (ref 5–15)
BUN: 10 mg/dL (ref 6–20)
CO2: 25 mmol/L (ref 22–32)
Calcium: 8.4 mg/dL — ABNORMAL LOW (ref 8.9–10.3)
Chloride: 106 mmol/L (ref 98–111)
Creatinine, Ser: 0.63 mg/dL (ref 0.44–1.00)
GFR, Estimated: >60 mL/min (ref >60)
Glucose, Bld: 102 mg/dL — ABNORMAL HIGH (ref 70–99)
Potassium: 3.1 mmol/L — ABNORMAL LOW (ref 3.5–5.1)
Sodium: 138 mmol/L (ref 135–145)
Total Bilirubin: 0.8 mg/dL (ref 0.3–1.2)
Total Protein: 6.5 g/dL (ref 6.5–8.1)

## 2022-07-16 LAB — CBC
HCT: 32.7 % — ABNORMAL LOW (ref 36.0–46.0)
Hemoglobin: 11.1 g/dL — ABNORMAL LOW (ref 12.0–15.0)
MCH: 29.9 pg (ref 26.0–34.0)
MCHC: 33.9 g/dL (ref 30.0–36.0)
MCV: 88.1 fL (ref 80.0–100.0)
Platelets: 215 10*3/uL (ref 150–400)
RBC: 3.71 MIL/uL — ABNORMAL LOW (ref 3.87–5.11)
RDW: 14.6 % (ref 11.5–15.5)
WBC: 8 10*3/uL (ref 4.0–10.5)
nRBC: 0 % (ref 0.0–0.2)

## 2022-07-16 LAB — MAGNESIUM: Magnesium: 2.2 mg/dL (ref 1.7–2.4)

## 2022-07-16 LAB — CK: Total CK: 3572 U/L — ABNORMAL HIGH (ref 38–234)

## 2022-07-16 MED ORDER — KCL-LACTATED RINGERS 20 MEQ/L IV SOLN
INTRAVENOUS | Status: DC
  Filled 2022-07-16: qty 1000

## 2022-07-16 MED ORDER — LORAZEPAM 2 MG/ML IJ SOLN
1.0000 mg | Freq: Once | INTRAMUSCULAR | Status: AC
  Administered 2022-07-16: 1 mg via INTRAVENOUS

## 2022-07-16 MED ORDER — OLANZAPINE 5 MG PO TBDP
7.5000 mg | ORAL_TABLET | Freq: Every day | ORAL | Status: DC
  Administered 2022-07-16 – 2022-07-17 (×2): 7.5 mg via ORAL
  Filled 2022-07-16 (×2): qty 1.5

## 2022-07-16 MED ORDER — LORAZEPAM 2 MG/ML IJ SOLN: 1.0000 mg | Freq: Once | INTRAMUSCULAR | Status: DC

## 2022-07-16 MED ORDER — HALOPERIDOL LACTATE 5 MG/ML IJ SOLN: 5.0000 mg | Freq: Once | INTRAMUSCULAR | Status: DC | PRN

## 2022-07-16 MED ORDER — HALOPERIDOL LACTATE 5 MG/ML IJ SOLN
5.0000 mg | Freq: Once | INTRAMUSCULAR | Status: AC | PRN
  Administered 2022-07-16: 5 mg via INTRAVENOUS

## 2022-07-16 MED ORDER — HALOPERIDOL LACTATE 5 MG/ML IJ SOLN
INTRAMUSCULAR | Status: AC
  Filled 2022-07-16: qty 1

## 2022-07-16 MED ORDER — POTASSIUM CHLORIDE CRYS ER 20 MEQ PO TBCR
40.0000 meq | EXTENDED_RELEASE_TABLET | ORAL | Status: AC
  Administered 2022-07-16 (×2): 40 meq via ORAL
  Filled 2022-07-16 (×2): qty 2

## 2022-07-16 MED ORDER — POTASSIUM CHLORIDE 2 MEQ/ML IV SOLN
INTRAVENOUS | Status: DC
  Filled 2022-07-16 (×6): qty 1000

## 2022-07-16 NOTE — Consult Note (Addendum)
[facility] Consult   Reason for Consult:  SI, psychosis after using hallucinogen Referring Physician:  Dr. Olevia Bowens Patient Identification: Tracey Lam MRN:  [id number] Principal Diagnosis: AMS (altered mental status) Diagnosis:  Principal Problem:   AMS (altered mental status) Active Problems:   Acute psychosis   Rhabdomyolysis  Total Time spent with patient: 1 hour  Subjective:   Tracey Lam is a 27 y.o. 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 She is visibly distraught by her relapse and displaying great degree of emotions and behavior switches. She is able to identify (2) conscious states of mind called Tracey Lam and Tracey Lam.   Currently on interview, the patient. Alert and oriented x4. Patient becomes tearful,loud/boisterous  at times during interview. Patient reports presenting to the emergency department after a suicide attempt via taking 20 "X pills" MDMA tablets.  She reports over the past couple weeks she has been having increase in anxiety, although she felt psychiatrically stable while on her medication.  She reports having the urge to use 1-2 ecstasy pills, and did not think it would have such a toll on her.  In contrast she does feel her suicide attempt was impulsive and while under the influence of hallucinogenic.  She is currently unable to accurately verbalize any symptoms of depression or anxiety, due to her psychotic state.  Her psychotic symptoms are very prominent and immediate upon awakening.  She continues to respond to internal and external stimuli.  Although she is redirectable, she remains floridly psychotic as she is observed yelling at the sitter, as the voices were telling her she is in danger.  Patient can be heard saying "everybody that is in this room is here to help me".  She endorses having manic symptoms including irritability, extended insomnia, racing thoughts, and impulsivity.  Patient does appear acutely manic on exam and she does not elicit or endorse any current psychotic symptoms.  Patient endorses active suicidal thoughts this morning, to overdose "again" after she is discharged.  Patient is observed begging to remain in the hospital "they do care about me Tracey Lam, and they are not going to put me out of the hospital.  Please do not send me out like this."   Recommend inpatient psychiatric hospitalization for crisis stabilization to address symptoms of substance-induced psychosis, mood lability including depression/mania, anxiety, and suicidal ideations.  Patient continues to remain danger to self and others will continue involuntary commitment. HPI: Tracey Lam is a 27 y.o. female with medical history significant of schizophrenia, bipolar disorder, depression, asthma, substance abuse brought into the ED by law enforcement due to concern for safety.  She was reportedly running in traffic and had to be restrained by law enforcement due to her behavior and placed under IVC. Hospitalist called to admit due to lab abnormalities/intoxication, psych consulted for further workup/planning in regards to known psychiatric illness.   Past Psychiatric History:  Pt w/ reported hx of "paranoid schizophrenia. Chart review shows history of Bipolar II, PTSD 2/t IPV, One history of suicide attempt by overdose, following a miscarriage.   Risk to Self:   Denies Risk to Others:   Denies Prior Inpatient Therapy:  Multiple more than 10x. Tracey Lam, Tracey Lam, [facility] care [facility], rehabilitation Prior Outpatient Therapy:   Tracey Lam  Past Medical History:  Past Medical History:  Diagnosis Date   Asthma    History reviewed. No pertinent surgical history. Family History: History reviewed. No pertinent family history.  Family Psychiatric  History: Pt denies family psychiatric hx Social History:  Social History   Substance and Sexual Activity  Alcohol Use Yes   Comment: Occassional      Social  History   Substance and Sexual Activity  Drug Use Yes   Types: Cocaine, Marijuana   Comment: pt reports she used last night     Social History   Socioeconomic History   Marital status: Single    Spouse name: Not on file   Number of children: Not on file   Years of education: Not on file   Highest education level: Not on file  Occupational History   Not on file  Tobacco Use   Smoking status: Former   Smokeless tobacco: Never  Substance and Sexual Activity   Alcohol use: Yes    Comment: Occassional    Drug use: Yes    Types: Cocaine, Marijuana    Comment: pt reports she used last night    Sexual activity: Yes  Other Topics Concern   Not on file  Social History Narrative   Not on file   Social Determinants of Health   Financial Resource Strain: Not on file  Food Insecurity: No Food Insecurity (07/16/2022)   Hunger Vital Sign    Worried About Running Out of Food in the Last Year: Never true    Ran Out of Food in the Last Year: Never true  Transportation Needs: No Transportation Needs (07/16/2022)   PRAPARE - Hydrologist (Medical): No    Lack of Transportation (Non-Medical): No  Physical Activity: Not on file  Stress: Not on file  Social Connections: Not on file   Additional Social History:    Allergies:  No Known Allergies  Labs:  Results for orders placed or performed during the hospital encounter of 07/14/22 (from the past 48 hour(s))  CBC with Differential/Platelet     Status: Abnormal   Collection Time: 07/14/22 10:05 PM  Result Value Ref Range   WBC 18.7 (H) 4.0 - 10.5 K/uL   RBC 5.13 (H) 3.87 - 5.11 MIL/uL   Hemoglobin 15.1 (H) 12.0 - 15.0 g/dL   HCT 44.0 36.0 - 46.0 %   MCV 85.8 80.0 - 100.0 fL   MCH 29.4 26.0 - 34.0 pg   MCHC 34.3 30.0 - 36.0 g/dL   RDW 14.1 11.5 - 15.5 %   Platelets 371 150 - 400 K/uL   nRBC 0.0 0.0 - 0.2 %   Neutrophils Relative % 83 %   Neutro Abs 15.5 (H) 1.7 - 7.7 K/uL   Lymphocytes Relative 10 %    Lymphs Abs 1.9 0.7 - 4.0 K/uL   Monocytes Relative 6 %   Monocytes Absolute 1.2 (H) 0.1 - 1.0 K/uL   Eosinophils Relative 0 %   Eosinophils Absolute 0.0 0.0 - 0.5 K/uL   Basophils Relative 0 %   Basophils Absolute 0.1 0.0 - 0.1 K/uL   Immature Granulocytes 1 %   Abs Immature Granulocytes 0.09 (H) 0.00 - 0.07 K/uL    Comment: Performed at [facility], [place] [address] [place], [place] [address]  Comprehensive metabolic panel     Status: Abnormal   Collection Time: 07/14/22 10:05 PM  Result Value Ref Range   Sodium 135 135 - 145 mmol/L   Potassium 2.9 (L) 3.5 - 5.1 mmol/L   Chloride 98 98 - 111 mmol/L   CO2 10 (L) 22 - 32 mmol/L   Glucose, Bld 148 (  H) 70 - 99 mg/dL    Comment: Glucose reference range applies only to samples taken after fasting for at least 8 hours.   BUN 28 (H) 6 - 20 mg/dL   Creatinine, Ser 1.97 (H) 0.44 - 1.00 mg/dL   Calcium 10.1 8.9 - 10.3 mg/dL   Total Protein 9.9 (H) 6.5 - 8.1 g/dL   Albumin 5.5 (H) 3.5 - 5.0 g/dL   AST 108 (H) 15 - 41 U/L   ALT 29 0 - 44 U/L   Alkaline Phosphatase 105 38 - 126 U/L   Total Bilirubin 1.2 0.3 - 1.2 mg/dL   GFR, Estimated 35 (L) >60 mL/min    Comment: (NOTE) Calculated using the CKD-EPI Creatinine Equation (2021)    Anion gap 27 (H) 5 - 15    Comment: ELECTROLYTES REPEATED TO VERIFY Performed at [place] [address] [place], [place] [address]   Ethanol     Status: None   Collection Time: 07/14/22 10:08 PM  Result Value Ref Range   Alcohol, Ethyl (B) <10 <10 mg/dL    Comment: (NOTE) Lowest detectable limit for serum alcohol is 10 mg/dL.  For medical purposes only. Performed at [facility], [place] [address] [place], [place] [address]   Acetaminophen level     Status: Abnormal   Collection Time: 07/14/22 10:08 PM  Result Value Ref Range   Acetaminophen (Tylenol), Serum <10 (L) 10 - 30 ug/mL    Comment: (NOTE) Therapeutic concentrations vary  significantly. A range of 10-30 ug/mL  may be an effective concentration for many patients. However, some  are best treated at concentrations outside of this range. Acetaminophen concentrations >150 ug/mL at 4 hours after ingestion  and >50 ug/mL at 12 hours after ingestion are often associated with  toxic reactions.  Performed at [facility], [place] [address] [place], [place] [id number]   Salicylate level     Status: Abnormal   Collection Time: 07/14/22 10:08 PM  Result Value Ref Range   Salicylate Lvl [id number] (L) 7.0 - 30.0 mg/dL    Comment: Performed at [facility], [place] [address] [place],  [address]  CK     Status: Abnormal   Collection Time: 07/14/22 10:08 PM  Result Value Ref Range   Total CK 7,090 (H) 38 - 234 U/L    Comment: RESULT CONFIRMED BY MANUAL DILUTION Performed at [facility], [place] [address] [place],  [address]   I-Stat beta hCG blood, ED ([facility], [facility], [facility] only)     Status: None   Collection Time: 07/14/22 10:13 PM  Result Value Ref Range   I-stat hCG, quantitative <5.0 <5 mIU/mL   Comment 3            Comment:   GEST. AGE      CONC.  (mIU/mL)   <=1 WEEK        5 - 50     2 WEEKS       50 - 500     3 WEEKS       100 - 10,000     4 WEEKS     1,000 - 30,000        FEMALE AND NON-PREGNANT FEMALE:     LESS THAN 5 mIU/mL   Rapid urine drug screen (hospital performed)     Status: Abnormal   Collection Time: 07/15/22  1:21 AM  Result Value Ref Range   Opiates NONE DETECTED NONE  DETECTED   Cocaine NONE DETECTED NONE DETECTED   Benzodiazepines NONE DETECTED NONE DETECTED   Amphetamines POSITIVE (A) NONE DETECTED   Tetrahydrocannabinol POSITIVE (A) NONE DETECTED   Barbiturates NONE DETECTED NONE DETECTED    Comment: (NOTE) DRUG SCREEN FOR MEDICAL PURPOSES ONLY.  IF CONFIRMATION IS NEEDED FOR ANY PURPOSE, NOTIFY LAB WITHIN 5 DAYS.  LOWEST DETECTABLE LIMITS FOR URINE DRUG SCREEN Drug Class                      Cutoff (ng/mL) Amphetamine and metabolites    1000 Barbiturate and metabolites    200 Benzodiazepine                 200 Opiates and metabolites        300 Cocaine and metabolites        300 THC                            50 Performed at [facility], [place] [address], [place], [place] [address]   Lactic acid, plasma     Status: None   Collection Time: 07/15/22  1:38 AM  Result Value Ref Range   Lactic Acid, Venous 1.5 0.5 - 1.9 mmol/L    Comment: Performed at [facility], [place] [address] [place], [place] [id number]  Basic metabolic panel     Status: Abnormal   Collection Time: 07/15/22  2:49 AM  Result Value Ref Range   Sodium 136 135 - 145 mmol/L   Potassium 3.1 (L) 3.5 - 5.1 mmol/L   Chloride 101 98 - 111 mmol/L   CO2 16 (L) 22 - 32 mmol/L   Glucose, Bld 94 70 - 99 mg/dL    Comment: Glucose reference range applies only to samples taken after fasting for at least 8 hours.   BUN 22 (H) 6 - 20 mg/dL   Creatinine, Ser 1.28 (H) 0.44 - 1.00 mg/dL   Calcium 9.2 8.9 - 10.3 mg/dL   GFR, Estimated 59 (L) >60 mL/min    Comment: (NOTE) Calculated using the CKD-EPI Creatinine Equation (2021)    Anion gap 19 (H) 5 - 15    Comment: Performed at [facility], [place] [address] [place], [place] [address]  CBC     Status: Abnormal   Collection Time: 07/15/22  6:18 AM  Result Value Ref Range   WBC 18.6 (H) 4.0 - 10.5 K/uL   RBC 4.54 3.87 - 5.11 MIL/uL   Hemoglobin 13.2 12.0 - 15.0 g/dL   HCT 39.3 36.0 - 46.0 %   MCV 86.6 80.0 - 100.0 fL   MCH 29.1 26.0 - 34.0 pg   MCHC 33.6 30.0 - 36.0 g/dL   RDW 14.2 11.5 - 15.5 %   Platelets 273 150 - 400 K/uL   nRBC 0.0 0.0 - 0.2 %    Comment: Performed at [facility], [place] [address] [place], [place] [address]  Comprehensive metabolic panel     Status: Abnormal   Collection Time: 07/15/22  6:18 AM  Result Value Ref Range   Sodium 135 135 - 145 mmol/L    Potassium 3.3 (L) 3.5 - 5.1 mmol/L   Chloride 100 98 - 111 mmol/L   CO2 18 (L) 22 - 32 mmol/L   Glucose, Bld 95 70 - 99 mg/dL    Comment: Glucose reference range applies only to samples taken after fasting for  at least 8 hours.   BUN 19 6 - 20 mg/dL   Creatinine, Ser 1.11 (H) 0.44 - 1.00 mg/dL   Calcium 9.0 8.9 - 10.3 mg/dL   Total Protein 8.4 (H) 6.5 - 8.1 g/dL   Albumin 4.7 3.5 - 5.0 g/dL   AST 82 (H) 15 - 41 U/L   ALT 26 0 - 44 U/L   Alkaline Phosphatase 81 38 - 126 U/L   Total Bilirubin 1.7 (H) 0.3 - 1.2 mg/dL   GFR, Estimated >60 >60 mL/min    Comment: (NOTE) Calculated using the CKD-EPI Creatinine Equation (2021)    Anion gap 17 (H) 5 - 15    Comment: Performed at [facility], [place] [address], [place], [place] [address]  CK     Status: Abnormal   Collection Time: 07/15/22  6:18 AM  Result Value Ref Range   Total CK 5,315 (H) 38 - 234 U/L    Comment: RESULT CONFIRMED BY MANUAL DILUTION Performed at [facility], [place] [address], [place], [place] [address]   Magnesium     Status: Abnormal   Collection Time: 07/15/22  6:18 AM  Result Value Ref Range   Magnesium 2.6 (H) 1.7 - 2.4 mg/dL    Comment: Performed at [facility], [place] [address], [place], [place] [address]  CBC     Status: Abnormal   Collection Time: 07/16/22  5:42 AM  Result Value Ref Range   WBC 8.0 4.0 - 10.5 K/uL   RBC 3.71 (L) 3.87 - 5.11 MIL/uL   Hemoglobin 11.1 (L) 12.0 - 15.0 g/dL   HCT 32.7 (L) 36.0 - 46.0 %   MCV 88.1 80.0 - 100.0 fL   MCH 29.9 26.0 - 34.0 pg   MCHC 33.9 30.0 - 36.0 g/dL   RDW 14.6 11.5 - 15.5 %   Platelets 215 150 - 400 K/uL   nRBC 0.0 0.0 - 0.2 %    Comment: Performed at [facility], [place] [address] [place], [place] [address]  Comprehensive metabolic panel     Status: Abnormal   Collection Time: 07/16/22  5:42 AM  Result Value Ref Range   Sodium 138 135 - 145 mmol/L   Potassium 3.1 (L) 3.5 - 5.1 mmol/L    Chloride 106 98 - 111 mmol/L   CO2 25 22 - 32 mmol/L   Glucose, Bld 102 (H) 70 - 99 mg/dL    Comment: Glucose reference range applies only to samples taken after fasting for at least 8 hours.   BUN 10 6 - 20 mg/dL   Creatinine, Ser 0.63 0.44 - 1.00 mg/dL   Calcium 8.4 (L) 8.9 - 10.3 mg/dL   Total Protein 6.5 6.5 - 8.1 g/dL   Albumin 3.4 (L) 3.5 - 5.0 g/dL   AST 65 (H) 15 - 41 U/L   ALT 22 0 - 44 U/L   Alkaline Phosphatase 58 38 - 126 U/L   Total Bilirubin 0.8 0.3 - 1.2 mg/dL   GFR, Estimated >60 >60 mL/min    Comment: (NOTE) Calculated using the CKD-EPI Creatinine Equation (2021)    Anion gap 7 5 - 15    Comment: Performed at [facility], [place] [address] [place], [place] [address]  Magnesium     Status: None   Collection Time: 07/16/22  5:42 AM  Result Value Ref Range   Magnesium 2.2 1.7 - 2.4 mg/dL    Comment: Performed at [facility], [place] [address] [place], [place] [address]  CK     Status: Abnormal   Collection Time: 07/16/22  5:42 AM  Result Value Ref Range   Total CK 3,572 (H) 38 - 234 U/L    Comment: Performed at [facility], [place] [address], [place], [place] [address]    Current Facility-Administered Medications  Medication Dose Route Frequency Provider Last Rate Last Admin   haloperidol lactate (HALDOL) 5 MG/ML injection            lactated ringers 1,000 mL with potassium chloride 20 mEq infusion   Intravenous Continuous Minda Ditto, RPH 125 mL/hr at 07/16/22 1032 New Bag at 07/16/22 1032   LORazepam (ATIVAN) injection 1 mg  1 mg Intravenous Q6H PRN Suella Broad, FNP   1 mg at 07/15/22 1425   nicotine (NICODERM CQ - dosed in mg/24 hours) patch 21 mg  21 mg Transdermal Daily Little Ishikawa, MD   21 mg at 07/16/22 1111   OLANZapine zydis (ZYPREXA) disintegrating tablet 5 mg  5 mg Oral QHS Suella Broad, FNP   5 mg at 07/15/22 2245    Musculoskeletal: Strength & Muscle Tone:  within normal limits Gait & Station: normal Patient leans: N/A   Psychiatric Specialty Exam:  Presentation  General Appearance: Appropriate for Environment; Casual  Eye Contact:Fair  Speech:Clear and Coherent; Normal Rate  Speech Volume:Normal  Handedness:Right   Mood and Affect  Mood:Euphoric; Labile  Affect:Blunt; Labile; Inappropriate; Tearful; Full Range   Thought Process  Thought Processes:Linear; Irrevelant  Descriptions of Associations:Tangential  Orientation:Full (Time, Place and Person)  Thought Content:Rumination; Logical; Tangential  History of Schizophrenia/Schizoaffective disorder:Yes  Duration of Psychotic Symptoms:N/A  Hallucinations:Hallucinations: Auditory Description of Auditory Hallucinations: carl and earl, negative thoughts and self talk. Description of Visual Hallucinations: UNABLE TO DESCRIBE  Ideas of Reference:Delusions  Suicidal Thoughts:Suicidal Thoughts: Yes, Passive SI Passive Intent and/or Plan: Without Intent; Without Plan; Without Means to Carry Out; Without Access to Means  Homicidal Thoughts:Homicidal Thoughts: No   Sensorium  Memory:Immediate Poor; Recent Fair; Remote Fair  Judgment:Poor  Insight:Poor   Executive Functions  Concentration:Fair  Attention Span:Fair  [place]   Psychomotor Activity  Psychomotor Activity:Psychomotor Activity: Restlessness   Assets  Assets:Communication Skills; Desire for Improvement; Financial Resources/Insurance; Social Support; Leisure Time; Resilience   Sleep  Sleep:Sleep: Fair   Physical Exam: Physical Exam Vitals and nursing note reviewed.  Constitutional:      Appearance: She is normal weight.  Neurological:     General: No focal deficit present.     Mental Status: She is alert, oriented to person, place, and time and easily aroused. Mental status is at baseline.  Psychiatric:        Attention and Perception:  Attention normal. She perceives visual hallucinations.        Mood and Affect: Mood is elated.        Speech: Speech is rapid and pressured and tangential.        Behavior: Behavior normal. Behavior is cooperative.        Thought Content: Thought content normal.        Judgment: Judgment normal.    Review of Systems  Constitutional:  Negative for diaphoresis.  Musculoskeletal:  Positive for myalgias (legs feel heavy).  Psychiatric/Behavioral:  Positive for depression, hallucinations, substance abuse and suicidal ideas. The patient is nervous/anxious and has insomnia.   All other systems reviewed and are negative.  Blood pressure  103/61, pulse 92, temperature 97.6 F (36.4 C), temperature source Oral, resp. rate (!) 22, height 4\' 10"  (1.473 m), weight 74 kg, SpO2 99 %. Body mass index is 34.1 kg/m.  Patient reports relapse in psychosis after use of (20+) MDMA pills. She reports doing well;while compliant with her medications. She reports being disappointed but didn't anticipate using them all as she planned to sell some. 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 At this time she is responding to external stimuli and noted to be swatting at things and eyes darting around the room. She reports visual hallucinations as well. Initially denied any suicide intent, however could not provide a concrete answer today.   In regards to her auditory hallucinations, these arose in the setting of substance use and are intermittent, which aligns with a substance-induced psychosis rather than a primary thought disorder. She is in rhabdomyolysis (CK: 5000 >3000), that is expected to trend down.    Treatment Plan Summary: Medication management and Plan WIll resume home medications at this time.  -Refrain from use of illicit substances that can increase or worsen psychosis.  -Continue IVC at this time. Continue safety sitter  -Will increase olanzapine 7.5mg  po qhs.  Patient with  elevated CK that is down trending.  On examination she continues to be psychotic.  At present current risk outweigh benefits for safety of patient and others.  There are no evident signs of in NMS or serotonin syndrome.  Patient does not have any rigidity on exam, and is observed ambulating without assistance to the restroom, with normal gait. Will start Ativan 1mg  po/iv q8hr prn for agitation. Will start Haldol 5mg  IV once prn for prn agitation or psychosis. Repeat CK levels tomorrow to ensure trending down, suspect this is 2/t significant use of MDMA and possible restraints    Disposition: Recommend psychiatric Inpatient admission when medically cleared.   Suella Broad, FNP 07/16/2022 3:09 PM

## 2022-07-16 NOTE — Progress Notes (Signed)
PROGRESS NOTE    Tracey Lam  [id number] DOB: [birth year] DOA: 07/14/2022 PCP: Patient, No Pcp Per   Brief Narrative:  Tracey Lam is a 27 y.o. female with medical history significant of schizophrenia, bipolar disorder, depression, asthma, substance abuse brought into the ED by law enforcement due to concern for safety.  She was reportedly running in traffic and had to be restrained by law enforcement due to her behavior and placed under IVC. Hospitalist called to admit due to lab abnormalities/intoxication, psych consulted for further workup/planning in regards to known psychiatric illness.  Assessment & Plan:   Principal Problem:   AMS (altered mental status) Active Problems:   Acute psychosis   Rhabdomyolysis  Substance-induced mood disorder/amphetamine abuse History of schizophrenia, bipolar disorder, and depression Acute toxic encephalopathy Questionable suicidal ideation or behavior - Patient presents under IVC with law enforcement - Psychiatry consulted given her history -appreciate insight and recommendations - Likely to resume antipsychotics per our conversation (tolerated olanzapine, ziprasidone in the ED with markedly improved symptoms). - Per report patient took upwards of 20 tablets, concerning for self-harm  Rhabdomyolysis -Likely secondary to above acute amphetamine toxicity as well as AKI -CK downtrending, continue IV fluids - likely can DC fluids in the next 24h   AKI -In the setting of rhabdomyolysis, acute intoxication and likely poor p.o. intake -Continue IV fluids, follow repeat creatinine, currently downtrending appropriately   Leukocytosis -Likely not infectious in etiology, presumed leukemoid reaction given above/hemoconcentration -Continue to follow CBC; remains afebrile -If signs or symptoms of infection appear would recommend cultures prior to initiation of antibiotics   Hypokalemia In the setting of AKI, follow repeat labs, replete as  appropriate.   High anion gap metabolic acidosis Improving, likely secondary to toxic encephalopathy and potential overdose as well as AKI   Elevated transaminase level Likely due to rhabdomyolysis/aki and dehydration, downtrending appropriately   Mild intermittent asthma Stable, no signs of acute exacerbation.  Does not seem to be on inhalers at home, pharmacy med rec pending.   DVT prophylaxis: SCDs Code Status: Full Code by default.  Patient currently does not have capacity for decision-making, no surrogate or prior directive available. Family Communication: No family available at this time.  Status is: Inpatient  Dispo: The patient is from: Home              Anticipated d/c is to: To be determined              Anticipated d/c date is: 24 to 48 hours              Patient currently not medically stable for discharge given ongoing need for IV fluids, close monitoring in the setting of AKI dehydration questionable suicidal ideation and toxic encephalopathy.  Consultants:  Psychiatry  Procedures:  None  Antimicrobials:  None  Subjective: No acute issues or events overnight, patient much more awake alert and oriented this morning - continues to yell at "Glendell Docker" who is not in the room  Objective: Vitals:   07/15/22 1410 07/15/22 1845 07/15/22 2100 07/16/22 0500  BP: 131/62 108/62 101/60 103/61  Pulse: 67 94 95 92  Resp: 20 (!) 22    Temp:  97.9 F (36.6 C) 97.7 F (36.5 C) 97.6 F (36.4 C)  TempSrc:  Oral Oral Oral  SpO2: 98% 100% 99% 99%  Weight:      Height:        Intake/Output Summary (Last 24 hours) at 07/16/2022 0753 Last data filed at 07/16/2022 0500  Gross per 24 hour  Intake 3862.44 ml  Output --  Net 3862.44 ml    Filed Weights   07/14/22 2145  Weight: 74 kg    Examination:  General:  Pleasantly resting in bed, No acute distress. HEENT:  Normocephalic atraumatic.  Sclerae nonicteric, noninjected.  Extraocular movements intact bilaterally. Neck:   Without mass or deformity.  Trachea is midline. Lungs:  Clear to auscultate bilaterally without rhonchi, wheeze, or rales. Heart:  Regular rate and rhythm.  Without murmurs, rubs, or gallops. Abdomen:  Soft, nontender, nondistended.  Without guarding or rebound. Extremities: Without cyanosis, clubbing, edema, or obvious deformity. Vascular:  Dorsalis pedis and posterior tibial pulses palpable bilaterally. Skin:  Warm and dry, no erythema, no ulcerations.  Data Reviewed: I have personally reviewed following labs and imaging studies  CBC: Recent Labs  Lab 07/14/22 2205 07/15/22 0618 07/16/22 0542  WBC 18.7* 18.6* 8.0  NEUTROABS 15.5*  --   --   HGB 15.1* 13.2 11.1*  HCT 44.0 39.3 32.7*  MCV 85.8 86.6 88.1  PLT 371 273 [id number]    Basic Metabolic Panel: Recent Labs  Lab 07/14/22 2205 07/15/22 0249 07/15/22 0618 07/16/22 0542  NA 135 136 135 138  K 2.9* 3.1* 3.3* 3.1*  CL 98 101 100 106  CO2 10* 16* 18* 25  GLUCOSE 148* 94 95 102*  BUN 28* 22* 19 10  CREATININE 1.97* 1.28* 1.11* 0.63  CALCIUM 10.1 9.2 9.0 8.4*  MG  --   --  2.6* 2.2    GFR: Estimated Creatinine Clearance: 91 mL/min (by C-G formula based on SCr of 0.63 mg/dL). Liver Function Tests: Recent Labs  Lab 07/14/22 2205 07/15/22 0618 07/16/22 0542  AST 108* 82* 65*  ALT 29 26 22   ALKPHOS 105 81 58  BILITOT 1.2 1.7* 0.8  PROT 9.9* 8.4* 6.5  ALBUMIN 5.5* 4.7 3.4*    No results for input(s): "LIPASE", "AMYLASE" in the last 168 hours. No results for input(s): "AMMONIA" in the last 168 hours. Coagulation Profile: No results for input(s): "INR", "PROTIME" in the last 168 hours. Cardiac Enzymes: Recent Labs  Lab 07/14/22 2208 07/15/22 0618 07/16/22 0542  CKTOTAL 7,090* 5,315* 3,572*    BNP (last 3 results) No results for input(s): "PROBNP" in the last 8760 hours. HbA1C: No results for input(s): "HGBA1C" in the last 72 hours. CBG: No results for input(s): "GLUCAP" in the last 168 hours. Lipid  Profile: No results for input(s): "CHOL", "HDL", "LDLCALC", "TRIG", "CHOLHDL", "LDLDIRECT" in the last 72 hours. Thyroid Function Tests: No results for input(s): "TSH", "T4TOTAL", "FREET4", "T3FREE", "THYROIDAB" in the last 72 hours. Anemia Panel: No results for input(s): "VITAMINB12", "FOLATE", "FERRITIN", "TIBC", "IRON", "RETICCTPCT" in the last 72 hours. Sepsis Labs: Recent Labs  Lab 07/15/22 0138  LATICACIDVEN 1.5     No results found for this or any previous visit (from the past 240 hour(s)).   Radiology Studies: No results found.  Scheduled Meds:  nicotine  21 mg Transdermal Daily   OLANZapine zydis  5 mg Oral QHS   potassium chloride  40 mEq Oral Q4H   Continuous Infusions:  lactated ringers 125 mL/hr at 07/15/22 2348     LOS: 1 day   Time spent: 57min  Tyanne Derocher C Kirby Cortese, DO [facility]  If 7PM-7AM, please contact night-coverage [email or web address]  07/16/2022, 7:53 AM

## 2022-07-16 NOTE — Plan of Care (Signed)
  Problem: Safety: Goal: Violent Restraint(s) Outcome: Progressing   Problem: Education: Goal: Knowledge of General Education information will improve Description: Including pain rating scale, medication(s)/side effects and non-pharmacologic comfort measures Outcome: Progressing   Problem: Health Behavior/Discharge Planning: Goal: Ability to manage health-related needs will improve Outcome: Progressing   Problem: Clinical Measurements: Goal: Ability to maintain clinical measurements within normal limits will improve Outcome: Progressing Goal: Will remain free from infection Outcome: Progressing Goal: Diagnostic test results will improve Outcome: Progressing Goal: Respiratory complications will improve Outcome: Progressing Goal: Cardiovascular complication will be avoided Outcome: Progressing   Problem: Activity: Goal: Risk for activity intolerance will decrease Outcome: Progressing   Problem: Nutrition: Goal: Adequate nutrition will be maintained Outcome: Progressing   Problem: Coping: Goal: Level of anxiety will decrease Outcome: Progressing   Problem: Elimination: Goal: Will not experience complications related to bowel motility Outcome: Progressing Goal: Will not experience complications related to urinary retention Outcome: Progressing   Problem: Pain Managment: Goal: General experience of comfort will improve Outcome: Progressing   Problem: Safety: Goal: Ability to remain free from injury will improve Outcome: Progressing   Problem: Skin Integrity: Goal: Risk for impaired skin integrity will decrease Outcome: Progressing   

## 2022-07-17 DIAGNOSIS — R4182 Altered mental status, unspecified: Secondary | ICD-10-CM | POA: Diagnosis not present

## 2022-07-17 LAB — CBC
HCT: 33.5 % — ABNORMAL LOW (ref 36.0–46.0)
Hemoglobin: 10.8 g/dL — ABNORMAL LOW (ref 12.0–15.0)
MCH: 29.6 pg (ref 26.0–34.0)
MCHC: 32.2 g/dL (ref 30.0–36.0)
MCV: 91.8 fL (ref 80.0–100.0)
Platelets: 187 10*3/uL (ref 150–400)
RBC: 3.65 MIL/uL — ABNORMAL LOW (ref 3.87–5.11)
RDW: 14.8 % (ref 11.5–15.5)
WBC: 8.4 10*3/uL (ref 4.0–10.5)
nRBC: 0 % (ref 0.0–0.2)

## 2022-07-17 LAB — CK: Total CK: 2824 U/L — ABNORMAL HIGH (ref 38–234)

## 2022-07-17 LAB — MAGNESIUM: Magnesium: 1.9 mg/dL (ref 1.7–2.4)

## 2022-07-17 LAB — COMPREHENSIVE METABOLIC PANEL
ALT: 26 U/L (ref 0–44)
AST: 58 U/L — ABNORMAL HIGH (ref 15–41)
Albumin: 3.5 g/dL (ref 3.5–5.0)
Alkaline Phosphatase: 59 U/L (ref 38–126)
Anion gap: 7 (ref 5–15)
BUN: 10 mg/dL (ref 6–20)
CO2: 23 mmol/L (ref 22–32)
Calcium: 8.5 mg/dL — ABNORMAL LOW (ref 8.9–10.3)
Chloride: 107 mmol/L (ref 98–111)
Creatinine, Ser: 0.61 mg/dL (ref 0.44–1.00)
GFR, Estimated: >60 mL/min (ref >60)
Glucose, Bld: 100 mg/dL — ABNORMAL HIGH (ref 70–99)
Potassium: 4.2 mmol/L (ref 3.5–5.1)
Sodium: 137 mmol/L (ref 135–145)
Total Bilirubin: 0.4 mg/dL (ref 0.3–1.2)
Total Protein: 6.4 g/dL — ABNORMAL LOW (ref 6.5–8.1)

## 2022-07-17 MED ORDER — LORAZEPAM 1 MG PO TABS
1.0000 mg | ORAL_TABLET | ORAL | Status: DC | PRN
  Administered 2022-07-17 – 2022-07-18 (×3): 1 mg via ORAL
  Filled 2022-07-17 (×3): qty 1

## 2022-07-17 MED ORDER — OLANZAPINE 15 MG PO TBDP: 7.5000 mg | ORAL_TABLET | Freq: Every day | ORAL | 0 refills | Status: AC

## 2022-07-17 NOTE — TOC Progression Note (Signed)
Transition of Care [facility]) - Progression Note    Patient Details  Name: Tracey Lam MRN: [id number] Date of Birth: [birth year]  Transition of Care [facility]) CM/SW [place], LCSW Phone Number: 07/17/2022, 4:23 PM  Clinical Narrative:    Pt has been accepted to [facility] for inpatient psychiatric placement. Pt is able to transfer after 8am on 07/18/22. She will be going to the main campus. Accepting provider is Dr. Otilio Connors. RN to call report to [phone]. Pt is under IVC and will be transported to their facility by law enforcement.    Expected Discharge Plan: Psychiatric Hospital Barriers to Discharge: Psych Bed not available  Expected Discharge Plan and Services   Discharge Planning Services: NA Post Acute Care Choice: NA Living arrangements for the past 2 months: Single Family Home Expected Discharge Date: 07/17/22               DME Arranged: N/A DME Agency: NA                   Social Determinants of Health (SDOH) Interventions SDOH Screenings   Food Insecurity: No Food Insecurity (07/16/2022)  Housing: Low Risk  (07/16/2022)  Transportation Needs: No Transportation Needs (07/16/2022)  Utilities: Not At Risk (07/16/2022)  Depression (PHQ2-9): Low Risk  (09/23/2021)  Tobacco Use: Medium Risk (07/14/2022)    Readmission Risk Interventions    07/17/2022    2:50 PM  Readmission Risk Prevention Plan  Transportation Screening Complete  PCP or Specialist Appt within 5-7 Days Complete  Home Care Screening Complete  Medication Review (RN CM) Complete

## 2022-07-17 NOTE — Consult Note (Addendum)
[place] Psychiatry Consult   Reason for Consult:  SI, psychosis after using hallucinogen Referring Physician:  Dr. Olevia Bowens Patient Identification: Tracey Lam MRN:  [id number] Principal Diagnosis: AMS (altered mental status) Diagnosis:  Principal Problem:   AMS (altered mental status) Active Problems:   Acute psychosis   Rhabdomyolysis  Total Time spent with patient: 1 hour  Subjective:   Tracey Lam is a 27 y.o. 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    On evaluation today patient appears to be at improving, there was reduced evidence of internal and external stimuli. There was also notable reduction in distraction(s) from auditory hallucinations " Glendell Docker". She is more tearful today and able to have a linear conversation. As previously noted her behavior of running into traffic, lying in the middle of the street, is likely due to psychosis from use of substances at that time. She is currently not psychotic and she is not currently manic.  She endorses thoughts of suicide, worsening depression, anhedonia, guilt, worthlessness and hopeless.  She does report compliance with her antipsychotic, and denies any use of prns. She further does not have any rigidity, stiffness, or tremors  on exam.  She denies any side effects or adverse reactions of olanzapine 7.5mg  po qhs.  SHe continues to have ongoing safety concerns, mild psychosis and meets criteria for inpatient admission.   On evaluation today patient is awake, alert, oriented x4, some behavioral control.  She does appear to be internally preoccupied. Recommend inpatient psychiatric hospitalization for crisis stabilization to address symptoms of substance-induced psychosis, mood lability including depression/mania, anxiety, and suicidal ideations.  Patient continues to remain danger to self and others will continue involuntary commitment.  HPI: Tracey Lam is a  27 y.o. female with medical history significant of schizophrenia, bipolar disorder, depression, asthma, substance abuse brought into the ED by law enforcement due to concern for safety.  She was reportedly running in traffic and had to be restrained by law enforcement due to her behavior and placed under IVC. Hospitalist called to admit due to lab abnormalities/intoxication, psych consulted for further workup/planning in regards to known psychiatric illness.   Past Psychiatric History:  Pt w/ reported hx of "paranoid schizophrenia. Chart review shows history of Bipolar II, PTSD 2/t IPV, One history of suicide attempt by overdose, following a miscarriage.   Risk to Self:   Denies Risk to Others:   Denies Prior Inpatient Therapy:  Multiple more than 10x. [facility], [facility], [facility] care system, rehabilitation Prior Outpatient Therapy:   [facility]  Past Medical History:  Past Medical History:  Diagnosis Date   Asthma    History reviewed. No pertinent surgical history. Family History: History reviewed. No pertinent family history. Family Psychiatric  History: Pt denies family psychiatric hx Social History:  Social History   Substance and Sexual Activity  Alcohol Use Yes   Comment: Occassional      Social History   Substance and Sexual Activity  Drug Use Yes   Types: Cocaine, Marijuana   Comment: pt reports she used last night     Social History   Socioeconomic History   Marital status: Single    Spouse name: Not on file   Number of children: Not on file   Years of education: Not on file   Highest education level: Not on file  Occupational History   Not on file  Tobacco Use   Smoking status: Former   Smokeless  tobacco: Never  Substance and Sexual Activity   Alcohol use: Yes    Comment: Occassional    Drug use: Yes    Types: Cocaine, Marijuana    Comment: pt reports she used last night    Sexual activity: Yes  Other Topics Concern   Not on file  Social History Narrative    Not on file   Social Determinants of Health   Financial Resource Strain: Not on file  Food Insecurity: No Food Insecurity (07/16/2022)   Hunger Vital Sign    Worried About Running Out of Food in the Last Year: Never true    Ran Out of Food in the Last Year: Never true  Transportation Needs: No Transportation Needs (07/16/2022)   PRAPARE - Hydrologist (Medical): No    Lack of Transportation (Non-Medical): No  Physical Activity: Not on file  Stress: Not on file  Social Connections: Not on file   Additional Social History:    Allergies:  No Known Allergies  Labs:  Results for orders placed or performed during the hospital encounter of 07/14/22 (from the past 48 hour(s))  CBC     Status: Abnormal   Collection Time: 07/16/22  5:42 AM  Result Value Ref Range   WBC 8.0 4.0 - 10.5 K/uL   RBC 3.71 (L) 3.87 - 5.11 MIL/uL   Hemoglobin 11.1 (L) 12.0 - 15.0 g/dL   HCT 32.7 (L) 36.0 - 46.0 %   MCV 88.1 80.0 - 100.0 fL   MCH 29.9 26.0 - 34.0 pg   MCHC 33.9 30.0 - 36.0 g/dL   RDW 14.6 11.5 - 15.5 %   Platelets 215 150 - 400 K/uL   nRBC 0.0 0.0 - 0.2 %    Comment: Performed at [facility], [place] [address], [place], [place] [address]  Comprehensive metabolic panel     Status: Abnormal   Collection Time: 07/16/22  5:42 AM  Result Value Ref Range   Sodium 138 135 - 145 mmol/L   Potassium 3.1 (L) 3.5 - 5.1 mmol/L   Chloride 106 98 - 111 mmol/L   CO2 25 22 - 32 mmol/L   Glucose, Bld 102 (H) 70 - 99 mg/dL    Comment: Glucose reference range applies only to samples taken after fasting for at least 8 hours.   BUN 10 6 - 20 mg/dL   Creatinine, Ser 0.63 0.44 - 1.00 mg/dL   Calcium 8.4 (L) 8.9 - 10.3 mg/dL   Total Protein 6.5 6.5 - 8.1 g/dL   Albumin 3.4 (L) 3.5 - 5.0 g/dL   AST 65 (H) 15 - 41 U/L   ALT 22 0 - 44 U/L   Alkaline Phosphatase 58 38 - 126 U/L   Total Bilirubin 0.8 0.3 - 1.2 mg/dL   GFR, Estimated >60 >60 mL/min    Comment:  (NOTE) Calculated using the CKD-EPI Creatinine Equation (2021)    Anion gap 7 5 - 15    Comment: Performed at [facility], [place] [address], [place], [place] [address]  Magnesium     Status: None   Collection Time: 07/16/22  5:42 AM  Result Value Ref Range   Magnesium 2.2 1.7 - 2.4 mg/dL    Comment: Performed at [facility], [place] [address], [place],  [address]  CK     Status: Abnormal   Collection Time: 07/16/22  5:42 AM  Result Value Ref Range   Total CK 3,572 (H)  38 - 234 U/L    Comment: Performed at [facility], [place] [address], [place], [place] [address]  CBC     Status: Abnormal   Collection Time: 07/17/22  5:58 AM  Result Value Ref Range   WBC 8.4 4.0 - 10.5 K/uL   RBC 3.65 (L) 3.87 - 5.11 MIL/uL   Hemoglobin 10.8 (L) 12.0 - 15.0 g/dL   HCT 33.5 (L) 36.0 - 46.0 %   MCV 91.8 80.0 - 100.0 fL   MCH 29.6 26.0 - 34.0 pg   MCHC 32.2 30.0 - 36.0 g/dL   RDW 14.8 11.5 - 15.5 %   Platelets 187 150 - 400 K/uL   nRBC 0.0 0.0 - 0.2 %    Comment: Performed at [facility], [place] [address], [place], [place] [address]  Comprehensive metabolic panel     Status: Abnormal   Collection Time: 07/17/22  5:58 AM  Result Value Ref Range   Sodium 137 135 - 145 mmol/L   Potassium 4.2 3.5 - 5.1 mmol/L   Chloride 107 98 - 111 mmol/L   CO2 23 22 - 32 mmol/L   Glucose, Bld 100 (H) 70 - 99 mg/dL    Comment: Glucose reference range applies only to samples taken after fasting for at least 8 hours.   BUN 10 6 - 20 mg/dL   Creatinine, Ser 0.61 0.44 - 1.00 mg/dL   Calcium 8.5 (L) 8.9 - 10.3 mg/dL   Total Protein 6.4 (L) 6.5 - 8.1 g/dL   Albumin 3.5 3.5 - 5.0 g/dL   AST 58 (H) 15 - 41 U/L   ALT 26 0 - 44 U/L   Alkaline Phosphatase 59 38 - 126 U/L   Total Bilirubin 0.4 0.3 - 1.2 mg/dL   GFR, Estimated >60 >60 mL/min    Comment: (NOTE) Calculated using the CKD-EPI Creatinine Equation (2021)    Anion gap 7 5 - 15     Comment: Performed at [facility], [place] [address], [place], [place] [address]  Magnesium     Status: None   Collection Time: 07/17/22  5:58 AM  Result Value Ref Range   Magnesium 1.9 1.7 - 2.4 mg/dL    Comment: Performed at [facility], [place] [address], [place], [place] [address]  CK     Status: Abnormal   Collection Time: 07/17/22  5:58 AM  Result Value Ref Range   Total CK 2,824 (H) 38 - 234 U/L    Comment: Performed at [facility], [place] [address] [place], [place] [address]    Current Facility-Administered Medications  Medication Dose Route Frequency Provider Last Rate Last Admin   lactated ringers 1,000 mL with potassium chloride 20 mEq infusion   Intravenous Continuous Minda Ditto, RPH 125 mL/hr at 07/17/22 0626 New Bag at 07/17/22 0626   LORazepam (ATIVAN) injection 1 mg  1 mg Intravenous Q6H PRN Suella [place], FNP   1 mg at 07/17/22 1056   nicotine (NICODERM CQ - dosed in mg/24 hours) patch 21 mg  21 mg Transdermal Daily Little Ishikawa, MD   21 mg at 07/17/22 0844   OLANZapine zydis (ZYPREXA) disintegrating tablet 7.5 mg  7.5 mg Oral QHS Suella [place], FNP   7.5 mg at 07/16/22 2241    Musculoskeletal: Strength & Muscle Tone: within normal limits Gait & Station: normal Patient leans: N/A   Psychiatric Specialty Exam:  Presentation  General Appearance: Appropriate for Environment;  Casual  Eye Contact:Fair  Speech:Clear and Coherent; Normal Rate  Speech Volume:Normal  Handedness:Right   Mood and Affect  Mood:Euphoric; Labile  Affect:Blunt; Labile; Inappropriate; Tearful; Full Range   Thought Process  Thought Processes:Linear; Irrevelant  Descriptions of Associations:Tangential  Orientation:Full (Time, Place and Person)  Thought Content:Rumination; Logical; Tangential  History of Schizophrenia/Schizoaffective disorder:Yes  Duration of Psychotic  Symptoms:N/A  Hallucinations:Hallucinations: Auditory Description of Auditory Hallucinations: carl and earl, negative thoughts and self talk.  Ideas of Reference:Delusions  Suicidal Thoughts:Suicidal Thoughts: Yes, Passive SI Passive Intent and/or Plan: Without Intent; Without Plan; Without Means to Carry Out; Without Access to Means  Homicidal Thoughts:Homicidal Thoughts: No   Sensorium  Memory:Immediate Poor; Recent Fair; Remote Fair  Judgment:Poor  Insight:Poor   Executive Functions  Concentration:Fair  Attention Span:Fair  Recall:Fair  Fund of Knowledge:Fair  Language:Fair   Psychomotor Activity  Psychomotor Activity:Psychomotor Activity: Restlessness   Assets  Assets:Communication Skills; Desire for Improvement; Financial Resources/Insurance; Social Support; Leisure Time; Resilience   Sleep  Sleep:Sleep: Fair   Physical Exam: Physical Exam Vitals and nursing note reviewed.  Constitutional:      Appearance: She is normal weight.  Neurological:     General: No focal deficit present.     Mental Status: She is alert, oriented to person, place, and time and easily aroused. Mental status is at baseline.  Psychiatric:        Attention and Perception: Attention normal. She perceives visual hallucinations.        Mood and Affect: Mood is elated.        Speech: Speech is rapid and pressured and tangential.        Behavior: Behavior normal. Behavior is cooperative.        Thought Content: Thought content normal.        Judgment: Judgment normal.    Review of Systems  Constitutional:  Negative for diaphoresis.  Musculoskeletal:  Positive for myalgias (legs feel heavy).  Psychiatric/Behavioral:  Positive for depression, hallucinations, substance abuse and suicidal ideas. The patient is nervous/anxious and has insomnia.   All other systems reviewed and are negative.  Blood pressure 108/67, pulse 85, temperature 97.7 F (36.5 C), temperature source Oral,  resp. rate 17, height  (1.473 m), weight 74 kg, SpO2 100 %. Body mass index is 34.1 kg/m.  Patient reports relapse in psychosis after use of (20+) MDMA pills. She reports doing well;while compliant with her medications. She reports being disappointed but didn't anticipate using them all as she planned to sell some. 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 At this time she is responding to external stimuli and noted to be swatting at things and eyes darting around the room. She reports visual hallucinations as well. Initially denied any suicide intent, however could not provide a concrete answer today.   In regards to her auditory hallucinations, these arose in the setting of substance use and are intermittent, which aligns with a substance-induced psychosis rather than a primary thought disorder. She is in rhabdomyolysis (CK: 5000 >3000), that is expected to trend down.    Treatment Plan Summary: Medication management and Plan WIll resume home medications at this time.  -Refrain from use of illicit substances that can increase or worsen psychosis.  -Continue IVC at this time. Continue safety sitter  -Will continue olanzapine 7.5mg  po qhs.  Patient with elevated CK that is down trending.  On examination she continues to be psychotic.  At present current risk outweigh benefits  for safety of patient and others.  There are no evident signs of in NMS or serotonin syndrome.  Patient does not have any rigidity on exam, and is observed ambulating without assistance to the restroom, with normal gait. Will continue Ativan 1mg  po/iv q8hr prn for agitation. Will dc Haldol 5mg  IV once prn for prn agitation or psychosis. CK levels are tranding down.   Patient has been accepted to [facility], scheduled for transport tomorrow. Will sign off at this time.   Disposition: Recommend psychiatric Inpatient admission when medically cleared.   Maryagnes Amos, FNP 07/17/2022  5:28 PM

## 2022-07-17 NOTE — Progress Notes (Signed)
PROGRESS NOTE    Tracey Lam  [id number] DOB: [birth year] DOA: 07/14/2022 PCP: Patient, No Pcp Per   Brief Narrative:  Tracey Lam is a 27 y.o. female with medical history significant of schizophrenia, bipolar disorder, depression, asthma, substance abuse brought into the ED by law enforcement due to concern for safety.  She was reportedly running in traffic and had to be restrained by law enforcement due to her behavior and placed under IVC. Hospitalist called to admit due to lab abnormalities/intoxication, psych consulted for further workup/planning in regards to known psychiatric illness.  Assessment & Plan:   Principal Problem:   AMS (altered mental status) Active Problems:   Acute psychosis   Rhabdomyolysis  Substance-induced mood disorder/amphetamine abuse History of schizophrenia, bipolar disorder, and depression Acute toxic encephalopathy Questionable suicidal ideation or behavior - Patient presents under IVC with law enforcement - Psychiatry consulted given her history -appreciate insight and recommendations - Likely to resume antipsychotics per our conversation (tolerated olanzapine, ziprasidone in the ED with markedly improved symptoms). - Per report patient took upwards of 20 tablets, concerning for self-harm  Rhabdomyolysis -Likely secondary to above acute amphetamine toxicity as well as AKI -CK downtrending, DC IV fluids -increase p.o. intake free water  AKI, resolved -In the setting of rhabdomyolysis, acute intoxication and likely poor p.o. intake -Continue IV fluids, follow repeat creatinine, currently downtrending appropriately   Leukocytosis, resolved -Likely not infectious in etiology, presumed leukemoid reaction given above/hemoconcentration -Continue to follow CBC; remains afebrile -If signs or symptoms of infection appear would recommend cultures prior to initiation of antibiotics   Hypokalemia In the setting of AKI, follow repeat labs, replete as  appropriate.   High anion gap metabolic acidosis Improving, likely secondary to toxic encephalopathy and potential overdose as well as AKI   Elevated transaminase level Likely due to rhabdomyolysis/aki and dehydration, downtrending appropriately   Mild intermittent asthma Stable, no signs of acute exacerbation.  Does not seem to be on inhalers at home, pharmacy med rec pending.   DVT prophylaxis: SCDs Code Status: Full Code by default.  Patient currently does not have capacity for decision-making, no surrogate or prior directive available. Family Communication: No family available at this time.  Status is: Inpatient  Dispo: The patient is from: Home              Anticipated d/c is to: To be determined              Anticipated d/c date is: 24 to 48 hours              Patient currently not medically stable for discharge given ongoing need for IV fluids, close monitoring in the setting of AKI dehydration questionable suicidal ideation and toxic encephalopathy.  Consultants:  Psychiatry  Procedures:  None  Antimicrobials:  None  Subjective: No acute issues or events overnight, patient much more awake alert and oriented this morning - continues to yell at "Glendell Docker" who is not in the room  Objective: Vitals:   07/15/22 2100 07/16/22 0500 07/16/22 2108 07/17/22 0353  BP: 101/60 103/61 105/71 122/85  Pulse: 95 92 80 75  Resp:   17 18  Temp: 97.7 F (36.5 C) 97.6 F (36.4 C) 98.3 F (36.8 C)   TempSrc: Oral Oral Oral Oral  SpO2: 99% 99% 100% 100%  Weight:      Height:        Intake/Output Summary (Last 24 hours) at 07/17/2022 1028 Last data filed at 07/17/2022 0900 Gross per 24 hour  Intake 779.86 ml  Output --  Net 779.86 ml    Filed Weights   07/14/22 2145  Weight: 74 kg    Examination:  General:  Pleasantly resting in bed, No acute distress. HEENT:  Normocephalic atraumatic.  Sclerae nonicteric, noninjected.  Extraocular movements intact bilaterally. Neck:   Without mass or deformity.  Trachea is midline. Lungs:  Clear to auscultate bilaterally without rhonchi, wheeze, or rales. Heart:  Regular rate and rhythm.  Without murmurs, rubs, or gallops. Abdomen:  Soft, nontender, nondistended.  Without guarding or rebound. Extremities: Without cyanosis, clubbing, edema, or obvious deformity. Vascular:  Dorsalis pedis and posterior tibial pulses palpable bilaterally. Skin:  Warm and dry, no erythema, no ulcerations.  Data Reviewed: I have personally reviewed following labs and imaging studies  CBC: Recent Labs  Lab 07/14/22 2205 07/15/22 0618 07/16/22 0542 07/17/22 0558  WBC 18.7* 18.6* 8.0 8.4  NEUTROABS 15.5*  --   --   --   HGB 15.1* 13.2 11.1* 10.8*  HCT 44.0 39.3 32.7* 33.5*  MCV 85.8 86.6 88.1 91.8  PLT 371 273 215 [id number]    Basic Metabolic Panel: Recent Labs  Lab 07/14/22 2205 07/15/22 0249 07/15/22 0618 07/16/22 0542 07/17/22 0558  NA 135 136 135 138 137  K 2.9* 3.1* 3.3* 3.1* 4.2  CL 98 101 100 106 107  CO2 10* 16* 18* 25 23  GLUCOSE 148* 94 95 102* 100*  BUN 28* 22* 19 10 10   CREATININE 1.97* 1.28* 1.11* 0.63 0.61  CALCIUM 10.1 9.2 9.0 8.4* 8.5*  MG  --   --  2.6* 2.2 1.9    GFR: Estimated Creatinine Clearance: 91 mL/min (by C-G formula based on SCr of 0.61 mg/dL). Liver Function Tests: Recent Labs  Lab 07/14/22 2205 07/15/22 0618 07/16/22 0542 07/17/22 0558  AST 108* 82* 65* 58*  ALT 29 26 22 26   ALKPHOS 105 81 58 59  BILITOT 1.2 1.7* 0.8 0.4  PROT 9.9* 8.4* 6.5 6.4*  ALBUMIN 5.5* 4.7 3.4* 3.5    No results for input(s): "LIPASE", "AMYLASE" in the last 168 hours. No results for input(s): "AMMONIA" in the last 168 hours. Coagulation Profile: No results for input(s): "INR", "PROTIME" in the last 168 hours. Cardiac Enzymes: Recent Labs  Lab 07/14/22 2208 07/15/22 0618 07/16/22 0542 07/17/22 0558  CKTOTAL 7,090* 5,315* 3,572* 2,824*    BNP (last 3 results) No results for input(s): "PROBNP" in the  last 8760 hours. HbA1C: No results for input(s): "HGBA1C" in the last 72 hours. CBG: No results for input(s): "GLUCAP" in the last 168 hours. Lipid Profile: No results for input(s): "CHOL", "HDL", "LDLCALC", "TRIG", "CHOLHDL", "LDLDIRECT" in the last 72 hours. Thyroid Function Tests: No results for input(s): "TSH", "T4TOTAL", "FREET4", "T3FREE", "THYROIDAB" in the last 72 hours. Anemia Panel: No results for input(s): "VITAMINB12", "FOLATE", "FERRITIN", "TIBC", "IRON", "RETICCTPCT" in the last 72 hours. Sepsis Labs: Recent Labs  Lab 07/15/22 0138  LATICACIDVEN 1.5     No results found for this or any previous visit (from the past 240 hour(s)).   Radiology Studies: No results found.  Scheduled Meds:  nicotine  21 mg Transdermal Daily   OLANZapine zydis  7.5 mg Oral QHS   Continuous Infusions:  lactated ringers 1,000 mL with potassium chloride 20 mEq infusion 125 mL/hr at 07/17/22 0626     LOS: 2 days   Time spent: 58min  Sereena Marando C Earon Rivest, DO [facility]  If 7PM-7AM, please contact night-coverage [email or web address]  07/17/2022, 10:28 AM

## 2022-07-17 NOTE — TOC Initial Note (Signed)
Transition of Care [facility]) - Initial/Assessment Note    Patient Details  Name: Tracey Lam MRN: [id number] Date of Birth: Sep 27, 1995  [facility] CM/SW Contact:    Vassie Moselle, [place] Phone Number: 07/17/2022, 2:54 PM  Clinical Narrative:                 Pt under IVC and currently recommended for inpatient psychiatric placement once medically cleared.  [facility] currently have no appropriate beds for this pt.  Have begun process of faxing patient out to psychiatric facilities for placement. Will continue to seek appropriate placement for pt.   Expected Discharge Plan: Psychiatric Hospital Barriers to Discharge: Psych Bed not available   Patient Goals and CMS Choice Patient states their goals for this hospitalization and ongoing recovery are:: Unknown CMS Medicare.gov Compare Post Acute Care list provided to:: Patient (NA) Choice offered to / list presented to :  (NA) [place] ownership interest in [facility].provided to::  (NA)    Expected Discharge Plan and Services   Discharge Planning Services: NA Post Acute Care Choice: NA Living arrangements for the past 2 months: Single Family Home Expected Discharge Date: 07/17/22               DME Arranged: N/A DME Agency: NA                  Prior Living Arrangements/Services Living arrangements for the past 2 months: Single Family Home Lives with:: Self Patient language and need for interpreter reviewed:: Yes Do you feel safe going back to the place where you live?: Yes      Need for Family Participation in Patient Care: Yes (Comment) Care giver support system in place?: No (comment)   Criminal Activity/Legal Involvement Pertinent to Current Situation/Hospitalization: No - Comment as needed  Activities of Daily Living Home Assistive Devices/Equipment: None ADL Screening (condition at time of admission) Patient's cognitive ability adequate to safely complete daily activities?: No Is the  patient deaf or have difficulty hearing?: No Does the patient have difficulty seeing, even when wearing glasses/contacts?: No Does the patient have difficulty concentrating, remembering, or making decisions?: Yes Patient able to express need for assistance with ADLs?: Yes Does the patient have difficulty dressing or bathing?: No Independently performs ADLs?: Yes (appropriate for developmental age) Does the patient have difficulty walking or climbing stairs?: No Weakness of Legs: None Weakness of Arms/Hands: None  Permission Sought/Granted   Permission granted to share information with : No              Emotional Assessment Appearance::  (Unable to assess) Attitude/Demeanor/Rapport: Unable to Assess Affect (typically observed): Unable to Assess Orientation: : Oriented to Self, Oriented to Place, Oriented to  Time, Oriented to Situation Alcohol / Substance Use: Alcohol Use, Illicit Drugs Psych Involvement: Yes (comment)  Admission diagnosis:  Rhabdomyolysis [M62.82] Acute psychosis [F23] AKI (acute kidney injury) [N17.9] Non-traumatic rhabdomyolysis [M62.82] AMS (altered mental status) [R41.82] Polysubstance use disorder [F19.90] Patient Active Problem List   Diagnosis Date Noted   AMS (altered mental status) 07/15/2022   Rhabdomyolysis 07/15/2022   AKI (acute kidney injury) 10/27/2021   Hypokalemia 10/27/2021   Hyperglycemia XX123456   Metabolic acidosis XX123456   Acute psychosis 10/27/2021   Leukocytosis 10/27/2021   Substance induced mood disorder 09/23/2021   Polysubstance abuse 09/23/2021   Current severe episode of major depressive disorder without psychotic features without prior episode 12/04/2019   Drug dependence, abuse 12/04/2019   Drug withdrawal syndrome 12/04/2019  PCP:  Patient, No Pcp Per Pharmacy:   [facility] [address] [place], [place] [address] Phone: [phone] Fax:  [place] [address] Phone: [phone] Fax: [phone]  [id number] [place], [place] [address] Phone: [phone] Fax: [phone]     Social Determinants of Health (SDOH) Social History: SDOH Screenings   Food Insecurity: No Food Insecurity (07/16/2022)  Housing: Low Risk  (07/16/2022)  Transportation Needs: No Transportation Needs (07/16/2022)  Utilities: Not At Risk (07/16/2022)  Depression (PHQ2-9): Low Risk  (09/23/2021)  Tobacco Use: Medium Risk (07/14/2022)   SDOH Interventions:     Readmission Risk Interventions    07/17/2022    2:50 PM  Readmission Risk Prevention Plan  Transportation Screening Complete  PCP or Specialist Appt within 5-7 Days Complete  Home Care Screening Complete  Medication Review (RN CM) Complete

## 2022-07-18 DIAGNOSIS — J45909 Unspecified asthma, uncomplicated: Secondary | ICD-10-CM | POA: Diagnosis not present

## 2022-07-18 DIAGNOSIS — Z6372 Alcoholism and drug addiction in family: Secondary | ICD-10-CM | POA: Diagnosis not present

## 2022-07-18 DIAGNOSIS — Z818 Family history of other mental and behavioral disorders: Secondary | ICD-10-CM | POA: Diagnosis not present

## 2022-07-18 DIAGNOSIS — F17290 Nicotine dependence, other tobacco product, uncomplicated: Secondary | ICD-10-CM | POA: Diagnosis not present

## 2022-07-18 DIAGNOSIS — F1911 Other psychoactive substance abuse, in remission: Secondary | ICD-10-CM | POA: Diagnosis not present

## 2022-07-18 DIAGNOSIS — M6282 Rhabdomyolysis: Secondary | ICD-10-CM | POA: Diagnosis not present

## 2022-07-18 DIAGNOSIS — Z653 Problems related to other legal circumstances: Secondary | ICD-10-CM | POA: Diagnosis not present

## 2022-07-18 DIAGNOSIS — T50904D Poisoning by unspecified drugs, medicaments and biological substances, undetermined, subsequent encounter: Secondary | ICD-10-CM | POA: Diagnosis not present

## 2022-07-18 DIAGNOSIS — F1220 Cannabis dependence, uncomplicated: Secondary | ICD-10-CM | POA: Diagnosis not present

## 2022-07-18 DIAGNOSIS — F1510 Other stimulant abuse, uncomplicated: Secondary | ICD-10-CM | POA: Diagnosis not present

## 2022-07-18 DIAGNOSIS — F250 Schizoaffective disorder, bipolar type: Secondary | ICD-10-CM | POA: Diagnosis not present

## 2022-07-18 DIAGNOSIS — R410 Disorientation, unspecified: Secondary | ICD-10-CM | POA: Diagnosis not present

## 2022-07-18 DIAGNOSIS — L309 Dermatitis, unspecified: Secondary | ICD-10-CM | POA: Diagnosis not present

## 2022-07-18 DIAGNOSIS — I10 Essential (primary) hypertension: Secondary | ICD-10-CM | POA: Diagnosis not present

## 2022-07-18 DIAGNOSIS — R4182 Altered mental status, unspecified: Secondary | ICD-10-CM | POA: Diagnosis not present

## 2022-07-18 DIAGNOSIS — Z9151 Personal history of suicidal behavior: Secondary | ICD-10-CM | POA: Diagnosis not present

## 2022-07-18 DIAGNOSIS — Z596 Low income: Secondary | ICD-10-CM | POA: Diagnosis not present

## 2022-07-18 DIAGNOSIS — Z91148 Patient's other noncompliance with medication regimen for other reason: Secondary | ICD-10-CM | POA: Diagnosis not present

## 2022-07-18 DIAGNOSIS — Z91411 Personal history of adult psychological abuse: Secondary | ICD-10-CM | POA: Diagnosis not present

## 2022-07-18 DIAGNOSIS — Z62810 Personal history of physical and sexual abuse in childhood: Secondary | ICD-10-CM | POA: Diagnosis not present

## 2022-07-18 DIAGNOSIS — R4587 Impulsiveness: Secondary | ICD-10-CM | POA: Diagnosis not present

## 2022-07-18 LAB — CBC
HCT: 35.4 % — ABNORMAL LOW (ref 36.0–46.0)
Hemoglobin: 11.7 g/dL — ABNORMAL LOW (ref 12.0–15.0)
MCH: 29.2 pg (ref 26.0–34.0)
MCHC: 33.1 g/dL (ref 30.0–36.0)
MCV: 88.3 fL (ref 80.0–100.0)
Platelets: 219 10*3/uL (ref 150–400)
RBC: 4.01 MIL/uL (ref 3.87–5.11)
RDW: 14.4 % (ref 11.5–15.5)
WBC: 7.6 10*3/uL (ref 4.0–10.5)
nRBC: 0 % (ref 0.0–0.2)

## 2022-07-18 LAB — CK: Total CK: 1625 U/L — ABNORMAL HIGH (ref 38–234)

## 2022-07-18 LAB — COMPREHENSIVE METABOLIC PANEL
ALT: 26 U/L (ref 0–44)
AST: 39 U/L (ref 15–41)
Albumin: 3.6 g/dL (ref 3.5–5.0)
Alkaline Phosphatase: 66 U/L (ref 38–126)
Anion gap: 6 (ref 5–15)
BUN: 10 mg/dL (ref 6–20)
CO2: 26 mmol/L (ref 22–32)
Calcium: 8.7 mg/dL — ABNORMAL LOW (ref 8.9–10.3)
Chloride: 106 mmol/L (ref 98–111)
Creatinine, Ser: 0.53 mg/dL (ref 0.44–1.00)
GFR, Estimated: >60 mL/min (ref >60)
Glucose, Bld: 99 mg/dL (ref 70–99)
Potassium: 3.9 mmol/L (ref 3.5–5.1)
Sodium: 138 mmol/L (ref 135–145)
Total Bilirubin: 0.3 mg/dL (ref 0.3–1.2)
Total Protein: 6.7 g/dL (ref 6.5–8.1)

## 2022-07-18 LAB — RPR: RPR Ser Ql: NONREACTIVE

## 2022-07-18 LAB — MAGNESIUM: Magnesium: 2.1 mg/dL (ref 1.7–2.4)

## 2022-07-18 LAB — HIV ANTIBODY (ROUTINE TESTING W REFLEX): HIV Screen 4th Generation wRfx: NONREACTIVE

## 2022-07-18 NOTE — Discharge Summary (Signed)
Physician Discharge Summary  Tracey Lam [id number] DOB: 1995-08-18 DOA: 07/14/2022  PCP: Patient, No Pcp Per  Admit date: 07/14/2022 Discharge date: 07/18/2022  Admitted From: Home Disposition:  Inpt psych  Discharge Condition:Stable  CODE STATUS:Full  Diet recommendation: As tolerated    Brief/Interim Summary: Tracey Lam is a 27 y.o. female with medical history significant of schizophrenia, bipolar disorder, depression, asthma, substance abuse brought into the ED by law enforcement due to concern for safety.  She was reportedly running in traffic and had to be restrained by law enforcement due to her behavior and placed under IVC. Hospitalist called to admit due to lab abnormalities/intoxication, psych consulted for further workup/planning in regards to known psychiatric illness.   Mental status slowly improving with medication adjustment per psych. Metabolic derangements/rhabdo improving daily - has not been on IVF for 24h and continues to improve with just PO intake. Stable for discharge from a medical standpoint to inpatient psych. Awaiting bed availability and transport.  Discharge Diagnoses:  Principal Problem:   AMS (altered mental status) Active Problems:   Acute psychosis   Rhabdomyolysis  Substance-induced mood disorder/amphetamine abuse History of schizophrenia, bipolar disorder, and depression Acute toxic encephalopathy Questionable suicidal ideation or behavior - Patient presents under IVC with law enforcement - Psychiatry consulted given her history -appreciate insight and recommendations - Per report patient took upwards of 20 tablets of ecstasy concerning for self-harm   Rhabdomyolysis, resolving -Likely secondary to above acute amphetamine toxicity as well as AKI -CK downtrending, DC IV fluids -increase p.o. intake free water   AKI, resolved -In the setting of rhabdomyolysis, acute intoxication and likely poor p.o. intake -Continue IV fluids, follow repeat  creatinine, currently downtrending appropriately   Leukocytosis, resolved -Likely not infectious in etiology, presumed leukemoid reaction given above/hemoconcentration -Continue to follow CBC; remains afebrile -If signs or symptoms of infection appear would recommend cultures prior to initiation of antibiotics   Hypokalemia, resolved In the setting of AKI, follow repeat labs, replete as appropriate.   High anion gap metabolic acidosis, resolved Improving, likely secondary to toxic encephalopathy and potential overdose as well as AKI   Elevated transaminase level Likely due to rhabdomyolysis/aki and dehydration, downtrending appropriately   Mild intermittent asthma Stable, no signs of acute exacerbation.  Does not seem to be on inhalers at home  Discharge Instructions   Allergies as of 07/18/2022   No Known Allergies      Medication List     TAKE these medications    OLANZapine zydis 15 MG disintegrating tablet Commonly known as: ZYPREXA Take 0.5 tablets (7.5 mg total) by mouth at bedtime.        No Known Allergies  Consultations: Psych   Procedures/Studies: No results found.   Subjective: No acute issues/events overnigh   Discharge Exam: Vitals:   07/17/22 2114 07/18/22 0543  BP: 112/71 110/80  Pulse: 89 75  Resp: 18 16  Temp: 98.1 F (36.7 C) 97.9 F (36.6 C)  SpO2: 100% 100%   Vitals:   07/17/22 0353 07/17/22 1355 07/17/22 2114 07/18/22 0543  BP: 122/85 108/67 112/71 110/80  Pulse: 75 85 89 75  Resp: 18 17 18 16   Temp:  97.7 F (36.5 C) 98.1 F (36.7 C) 97.9 F (36.6 C)  TempSrc: Oral Oral Oral Oral  SpO2: 100% 100% 100% 100%  Weight:      Height:        General: Pt is alert, awake, not in acute distress Cardiovascular: RRR, S1/S2 +, no rubs, no gallops  Respiratory: CTA bilaterally, no wheezing, no rhonchi Abdominal: Soft, NT, ND, bowel sounds + Extremities: no edema, no cyanosis    The results of significant diagnostics from this  hospitalization (including imaging, microbiology, ancillary and laboratory) are listed below for reference.     Microbiology: No results found for this or any previous visit (from the past 240 hour(s)).   Labs: BNP (last 3 results) No results for input(s): "BNP" in the last 8760 hours. Basic Metabolic Panel: Recent Labs  Lab 07/15/22 0249 07/15/22 0618 07/16/22 0542 07/17/22 0558 07/18/22 0540  NA 136 135 138 137 138  K 3.1* 3.3* 3.1* 4.2 3.9  CL 101 100 106 107 106  CO2 16* 18* 25 23 26   GLUCOSE 94 95 102* 100* 99  BUN 22* 19 10 10 10   CREATININE 1.28* 1.11* 0.63 0.61 0.53  CALCIUM 9.2 9.0 8.4* 8.5* 8.7*  MG  --  2.6* 2.2 1.9 2.1   Liver Function Tests: Recent Labs  Lab 07/14/22 2205 07/15/22 0618 07/16/22 0542 07/17/22 0558 07/18/22 0540  AST 108* 82* 65* 58* 39  ALT 29 26 22 26 26   ALKPHOS 105 81 58 59 66  BILITOT 1.2 1.7* 0.8 0.4 0.3  PROT 9.9* 8.4* 6.5 6.4* 6.7  ALBUMIN 5.5* 4.7 3.4* 3.5 3.6   No results for input(s): "LIPASE", "AMYLASE" in the last 168 hours. No results for input(s): "AMMONIA" in the last 168 hours. CBC: Recent Labs  Lab 07/14/22 2205 07/15/22 0618 07/16/22 0542 07/17/22 0558 07/18/22 0540  WBC 18.7* 18.6* 8.0 8.4 7.6  NEUTROABS 15.5*  --   --   --   --   HGB 15.1* 13.2 11.1* 10.8* 11.7*  HCT 44.0 39.3 32.7* 33.5* 35.4*  MCV 85.8 86.6 88.1 91.8 88.3  PLT 371 273 215 187 219   Cardiac Enzymes: Recent Labs  Lab 07/14/22 2208 07/15/22 0618 07/16/22 0542 07/17/22 0558 07/18/22 0540  CKTOTAL 7,090* 5,315* 3,572* 2,824* 1,625*   BNP: Invalid input(s): "POCBNP" CBG: No results for input(s): "GLUCAP" in the last 168 hours. D-Dimer No results for input(s): "DDIMER" in the last 72 hours. Hgb A1c No results for input(s): "HGBA1C" in the last 72 hours. Lipid Profile No results for input(s): "CHOL", "HDL", "LDLCALC", "TRIG", "CHOLHDL", "LDLDIRECT" in the last 72 hours. Thyroid function studies No results for input(s): "TSH",  "T4TOTAL", "T3FREE", "THYROIDAB" in the last 72 hours.  Invalid input(s): "FREET3" Anemia work up No results for input(s): "VITAMINB12", "FOLATE", "FERRITIN", "TIBC", "IRON", "RETICCTPCT" in the last 72 hours. Urinalysis    Component Value Date/Time   COLORURINE BROWN (A) 09/24/2021 0820   APPEARANCEUR TURBID (A) 09/24/2021 0820   LABSPEC >1.030 (H) 09/24/2021 0820   PHURINE 6.0 09/24/2021 0820   GLUCOSEU NEGATIVE 09/24/2021 0820   HGBUR NEGATIVE 09/24/2021 0820   BILIRUBINUR NEGATIVE 09/24/2021 0820   KETONESUR NEGATIVE 09/24/2021 0820   PROTEINUR 100 (A) 09/24/2021 0820   NITRITE NEGATIVE 09/24/2021 0820   LEUKOCYTESUR NEGATIVE 09/24/2021 0820   Sepsis Labs Recent Labs  Lab 07/15/22 0618 07/16/22 0542 07/17/22 0558 07/18/22 0540  WBC 18.6* 8.0 8.4 7.6   Microbiology No results found for this or any previous visit (from the past 240 hour(s)).   Time coordinating discharge: Over 30 minutes  SIGNED:   Azucena FallenWilliam C Brittanie Dosanjh, DO [facility] 07/18/2022, 7:52 AM Pager   If 7PM-7AM, please contact night-coverage [email or web address]

## 2022-07-18 NOTE — Progress Notes (Signed)
Obtained return call from [facility] facility. Reported pt information to [facility], who verbalized understanding of all information & confirmed having no other questions at this time.

## 2022-07-18 NOTE — TOC Transition Note (Signed)
Transition of Care [facility]) - CM/SW Discharge Note   Patient Details  Name: Tracey Lam MRN: [id number] Date of Birth: [birth year]  Transition of Care [facility]) CM/SW Contact:  Otelia Santee, LCSW Phone Number: 07/18/2022, 9:25 AM   Clinical Narrative:    Pt to transfer to [facility]. [employer] called for transportation.      Barriers to Discharge: Psych Bed not available   Patient Goals and CMS Choice CMS Medicare.gov Compare Post Acute Care list provided to:: Patient (NA) Choice offered to / list presented to :  (NA)  Discharge Placement                         Discharge Plan and Services Additional resources added to the After Visit Summary for     Discharge Planning Services: NA Post Acute Care Choice: NA          DME Arranged: N/A DME Agency: NA                  Social Determinants of Health (SDOH) Interventions SDOH Screenings   Food Insecurity: No Food Insecurity (07/16/2022)  Housing: Low Risk  (07/16/2022)  Transportation Needs: No Transportation Needs (07/16/2022)  Utilities: Not At Risk (07/16/2022)  Depression (PHQ2-9): Low Risk  (09/23/2021)  Tobacco Use: Medium Risk (07/14/2022)     Readmission Risk Interventions    07/17/2022    2:50 PM  Readmission Risk Prevention Plan  Transportation Screening Complete  PCP or Specialist Appt within 5-7 Days Complete  Home Care Screening Complete  Medication Review (RN CM) Complete

## 2022-07-18 NOTE — Progress Notes (Signed)
Pt is now being transferred to new facility via security. RN was unable to perform head to toe, r/t being present with another pt during an emergent bleed. Pt was angry & acting out. Administered nicotine patch and ativan 1 mg po prior to transport.  1007 Called to give report to [facility], receiving facility. No answer. Left message, requesting call for return ASAP. Phone number provided on message

## 2022-07-21 DIAGNOSIS — T50904D Poisoning by unspecified drugs, medicaments and biological substances, undetermined, subsequent encounter: Secondary | ICD-10-CM | POA: Diagnosis not present

## 2022-07-21 DIAGNOSIS — F1220 Cannabis dependence, uncomplicated: Secondary | ICD-10-CM | POA: Diagnosis not present

## 2022-07-21 DIAGNOSIS — J45909 Unspecified asthma, uncomplicated: Secondary | ICD-10-CM | POA: Diagnosis not present

## 2022-07-21 DIAGNOSIS — F250 Schizoaffective disorder, bipolar type: Secondary | ICD-10-CM | POA: Diagnosis not present

## 2022-07-21 DIAGNOSIS — L309 Dermatitis, unspecified: Secondary | ICD-10-CM | POA: Diagnosis not present

## 2022-07-21 DIAGNOSIS — F1911 Other psychoactive substance abuse, in remission: Secondary | ICD-10-CM | POA: Diagnosis not present

## 2022-07-21 DIAGNOSIS — M6282 Rhabdomyolysis: Secondary | ICD-10-CM | POA: Diagnosis not present

## 2022-07-21 DIAGNOSIS — R4587 Impulsiveness: Secondary | ICD-10-CM | POA: Diagnosis not present

## 2022-07-21 DIAGNOSIS — Z9151 Personal history of suicidal behavior: Secondary | ICD-10-CM | POA: Diagnosis not present

## 2022-07-21 DIAGNOSIS — I10 Essential (primary) hypertension: Secondary | ICD-10-CM | POA: Diagnosis not present

## 2022-07-21 DIAGNOSIS — F17290 Nicotine dependence, other tobacco product, uncomplicated: Secondary | ICD-10-CM | POA: Diagnosis not present

## 2022-07-21 DIAGNOSIS — F1510 Other stimulant abuse, uncomplicated: Secondary | ICD-10-CM | POA: Diagnosis not present

## 2022-07-22 DIAGNOSIS — F250 Schizoaffective disorder, bipolar type: Secondary | ICD-10-CM | POA: Diagnosis not present

## 2022-07-22 DIAGNOSIS — F17290 Nicotine dependence, other tobacco product, uncomplicated: Secondary | ICD-10-CM | POA: Diagnosis not present

## 2022-07-22 DIAGNOSIS — F1510 Other stimulant abuse, uncomplicated: Secondary | ICD-10-CM | POA: Diagnosis not present

## 2022-07-22 DIAGNOSIS — F1220 Cannabis dependence, uncomplicated: Secondary | ICD-10-CM | POA: Diagnosis not present

## 2022-07-22 DIAGNOSIS — Z9151 Personal history of suicidal behavior: Secondary | ICD-10-CM | POA: Diagnosis not present

## 2022-07-22 DIAGNOSIS — F1911 Other psychoactive substance abuse, in remission: Secondary | ICD-10-CM | POA: Diagnosis not present

## 2022-07-22 DIAGNOSIS — J45909 Unspecified asthma, uncomplicated: Secondary | ICD-10-CM | POA: Diagnosis not present

## 2022-07-22 DIAGNOSIS — L309 Dermatitis, unspecified: Secondary | ICD-10-CM | POA: Diagnosis not present

## 2022-07-22 DIAGNOSIS — I10 Essential (primary) hypertension: Secondary | ICD-10-CM | POA: Diagnosis not present

## 2022-07-22 DIAGNOSIS — R4587 Impulsiveness: Secondary | ICD-10-CM | POA: Diagnosis not present

## 2022-07-22 DIAGNOSIS — T50904D Poisoning by unspecified drugs, medicaments and biological substances, undetermined, subsequent encounter: Secondary | ICD-10-CM | POA: Diagnosis not present

## 2022-07-22 DIAGNOSIS — M6282 Rhabdomyolysis: Secondary | ICD-10-CM | POA: Diagnosis not present

## 2022-07-23 DIAGNOSIS — R4587 Impulsiveness: Secondary | ICD-10-CM | POA: Diagnosis not present

## 2022-07-23 DIAGNOSIS — T50904D Poisoning by unspecified drugs, medicaments and biological substances, undetermined, subsequent encounter: Secondary | ICD-10-CM | POA: Diagnosis not present

## 2022-07-23 DIAGNOSIS — F1911 Other psychoactive substance abuse, in remission: Secondary | ICD-10-CM | POA: Diagnosis not present

## 2022-07-23 DIAGNOSIS — F17290 Nicotine dependence, other tobacco product, uncomplicated: Secondary | ICD-10-CM | POA: Diagnosis not present

## 2022-07-23 DIAGNOSIS — F250 Schizoaffective disorder, bipolar type: Secondary | ICD-10-CM | POA: Diagnosis not present

## 2022-07-23 DIAGNOSIS — L309 Dermatitis, unspecified: Secondary | ICD-10-CM | POA: Diagnosis not present

## 2022-07-23 DIAGNOSIS — M6282 Rhabdomyolysis: Secondary | ICD-10-CM | POA: Diagnosis not present

## 2022-07-23 DIAGNOSIS — Z9151 Personal history of suicidal behavior: Secondary | ICD-10-CM | POA: Diagnosis not present

## 2022-07-23 DIAGNOSIS — F1510 Other stimulant abuse, uncomplicated: Secondary | ICD-10-CM | POA: Diagnosis not present

## 2022-07-23 DIAGNOSIS — I10 Essential (primary) hypertension: Secondary | ICD-10-CM | POA: Diagnosis not present

## 2022-07-23 DIAGNOSIS — J45909 Unspecified asthma, uncomplicated: Secondary | ICD-10-CM | POA: Diagnosis not present

## 2022-07-23 DIAGNOSIS — F1220 Cannabis dependence, uncomplicated: Secondary | ICD-10-CM | POA: Diagnosis not present

## 2022-07-24 DIAGNOSIS — F1510 Other stimulant abuse, uncomplicated: Secondary | ICD-10-CM | POA: Diagnosis not present

## 2022-07-24 DIAGNOSIS — M6282 Rhabdomyolysis: Secondary | ICD-10-CM | POA: Diagnosis not present

## 2022-07-24 DIAGNOSIS — F1911 Other psychoactive substance abuse, in remission: Secondary | ICD-10-CM | POA: Diagnosis not present

## 2022-07-24 DIAGNOSIS — F1220 Cannabis dependence, uncomplicated: Secondary | ICD-10-CM | POA: Diagnosis not present

## 2022-07-24 DIAGNOSIS — J45909 Unspecified asthma, uncomplicated: Secondary | ICD-10-CM | POA: Diagnosis not present

## 2022-07-24 DIAGNOSIS — R4587 Impulsiveness: Secondary | ICD-10-CM | POA: Diagnosis not present

## 2022-07-24 DIAGNOSIS — Z9151 Personal history of suicidal behavior: Secondary | ICD-10-CM | POA: Diagnosis not present

## 2022-07-24 DIAGNOSIS — L309 Dermatitis, unspecified: Secondary | ICD-10-CM | POA: Diagnosis not present

## 2022-07-24 DIAGNOSIS — F250 Schizoaffective disorder, bipolar type: Secondary | ICD-10-CM | POA: Diagnosis not present

## 2022-07-24 DIAGNOSIS — F17290 Nicotine dependence, other tobacco product, uncomplicated: Secondary | ICD-10-CM | POA: Diagnosis not present

## 2022-07-24 DIAGNOSIS — T50904D Poisoning by unspecified drugs, medicaments and biological substances, undetermined, subsequent encounter: Secondary | ICD-10-CM | POA: Diagnosis not present

## 2022-07-24 DIAGNOSIS — I10 Essential (primary) hypertension: Secondary | ICD-10-CM | POA: Diagnosis not present

## 2022-07-25 DIAGNOSIS — I10 Essential (primary) hypertension: Secondary | ICD-10-CM | POA: Diagnosis not present

## 2022-07-25 DIAGNOSIS — L309 Dermatitis, unspecified: Secondary | ICD-10-CM | POA: Diagnosis not present

## 2022-07-25 DIAGNOSIS — F1220 Cannabis dependence, uncomplicated: Secondary | ICD-10-CM | POA: Diagnosis not present

## 2022-07-25 DIAGNOSIS — M6282 Rhabdomyolysis: Secondary | ICD-10-CM | POA: Diagnosis not present

## 2022-07-25 DIAGNOSIS — F1911 Other psychoactive substance abuse, in remission: Secondary | ICD-10-CM | POA: Diagnosis not present

## 2022-07-25 DIAGNOSIS — Z9151 Personal history of suicidal behavior: Secondary | ICD-10-CM | POA: Diagnosis not present

## 2022-07-25 DIAGNOSIS — R4587 Impulsiveness: Secondary | ICD-10-CM | POA: Diagnosis not present

## 2022-07-25 DIAGNOSIS — J45909 Unspecified asthma, uncomplicated: Secondary | ICD-10-CM | POA: Diagnosis not present

## 2022-07-25 DIAGNOSIS — F17290 Nicotine dependence, other tobacco product, uncomplicated: Secondary | ICD-10-CM | POA: Diagnosis not present

## 2022-07-25 DIAGNOSIS — F1510 Other stimulant abuse, uncomplicated: Secondary | ICD-10-CM | POA: Diagnosis not present

## 2022-07-25 DIAGNOSIS — F250 Schizoaffective disorder, bipolar type: Secondary | ICD-10-CM | POA: Diagnosis not present

## 2022-07-25 DIAGNOSIS — T50904D Poisoning by unspecified drugs, medicaments and biological substances, undetermined, subsequent encounter: Secondary | ICD-10-CM | POA: Diagnosis not present

## 2022-07-28 DIAGNOSIS — F250 Schizoaffective disorder, bipolar type: Secondary | ICD-10-CM | POA: Diagnosis not present

## 2022-08-25 DIAGNOSIS — F319 Bipolar disorder, unspecified: Secondary | ICD-10-CM | POA: Diagnosis not present

## 2022-08-25 DIAGNOSIS — F1210 Cannabis abuse, uncomplicated: Secondary | ICD-10-CM | POA: Diagnosis not present

## 2022-08-25 DIAGNOSIS — F17210 Nicotine dependence, cigarettes, uncomplicated: Secondary | ICD-10-CM | POA: Diagnosis not present

## 2022-08-25 DIAGNOSIS — F1510 Other stimulant abuse, uncomplicated: Secondary | ICD-10-CM | POA: Diagnosis not present

## 2022-08-25 DIAGNOSIS — R45850 Homicidal ideations: Secondary | ICD-10-CM | POA: Diagnosis not present

## 2022-08-25 DIAGNOSIS — Z91148 Patient's other noncompliance with medication regimen for other reason: Secondary | ICD-10-CM | POA: Diagnosis not present

## 2022-08-25 DIAGNOSIS — Z5901 Sheltered homelessness: Secondary | ICD-10-CM | POA: Diagnosis not present

## 2022-08-25 DIAGNOSIS — Z9151 Personal history of suicidal behavior: Secondary | ICD-10-CM | POA: Diagnosis not present

## 2022-08-25 DIAGNOSIS — R45851 Suicidal ideations: Secondary | ICD-10-CM | POA: Diagnosis not present

## 2022-08-25 DIAGNOSIS — I10 Essential (primary) hypertension: Secondary | ICD-10-CM | POA: Diagnosis not present

## 2022-08-25 DIAGNOSIS — Z62810 Personal history of physical and sexual abuse in childhood: Secondary | ICD-10-CM | POA: Diagnosis not present

## 2022-08-25 DIAGNOSIS — Z634 Disappearance and death of family member: Secondary | ICD-10-CM | POA: Diagnosis not present

## 2022-09-01 DIAGNOSIS — Z91148 Patient's other noncompliance with medication regimen for other reason: Secondary | ICD-10-CM | POA: Diagnosis not present

## 2022-09-01 DIAGNOSIS — F1510 Other stimulant abuse, uncomplicated: Secondary | ICD-10-CM | POA: Diagnosis not present

## 2022-09-01 DIAGNOSIS — I10 Essential (primary) hypertension: Secondary | ICD-10-CM | POA: Diagnosis not present

## 2022-09-01 DIAGNOSIS — Z62810 Personal history of physical and sexual abuse in childhood: Secondary | ICD-10-CM | POA: Diagnosis not present

## 2022-09-01 DIAGNOSIS — F1210 Cannabis abuse, uncomplicated: Secondary | ICD-10-CM | POA: Diagnosis not present

## 2022-09-01 DIAGNOSIS — Z634 Disappearance and death of family member: Secondary | ICD-10-CM | POA: Diagnosis not present

## 2022-09-01 DIAGNOSIS — F17210 Nicotine dependence, cigarettes, uncomplicated: Secondary | ICD-10-CM | POA: Diagnosis not present

## 2022-09-01 DIAGNOSIS — Z5901 Sheltered homelessness: Secondary | ICD-10-CM | POA: Diagnosis not present

## 2022-09-01 DIAGNOSIS — R45851 Suicidal ideations: Secondary | ICD-10-CM | POA: Diagnosis not present

## 2022-09-01 DIAGNOSIS — F319 Bipolar disorder, unspecified: Secondary | ICD-10-CM | POA: Diagnosis not present

## 2022-09-01 DIAGNOSIS — Z9151 Personal history of suicidal behavior: Secondary | ICD-10-CM | POA: Diagnosis not present

## 2022-09-01 DIAGNOSIS — R45850 Homicidal ideations: Secondary | ICD-10-CM | POA: Diagnosis not present

## 2022-09-04 DIAGNOSIS — R451 Restlessness and agitation: Secondary | ICD-10-CM | POA: Diagnosis not present

## 2022-09-04 DIAGNOSIS — F4310 Post-traumatic stress disorder, unspecified: Secondary | ICD-10-CM | POA: Diagnosis not present

## 2022-09-04 DIAGNOSIS — F1220 Cannabis dependence, uncomplicated: Secondary | ICD-10-CM | POA: Diagnosis not present

## 2022-09-04 DIAGNOSIS — I10 Essential (primary) hypertension: Secondary | ICD-10-CM | POA: Diagnosis not present

## 2022-09-04 DIAGNOSIS — F429 Obsessive-compulsive disorder, unspecified: Secondary | ICD-10-CM | POA: Diagnosis not present

## 2022-09-04 DIAGNOSIS — R440 Auditory hallucinations: Secondary | ICD-10-CM | POA: Diagnosis not present

## 2022-09-04 DIAGNOSIS — F332 Major depressive disorder, recurrent severe without psychotic features: Secondary | ICD-10-CM | POA: Diagnosis not present

## 2022-09-04 DIAGNOSIS — R45851 Suicidal ideations: Secondary | ICD-10-CM | POA: Diagnosis not present

## 2022-09-04 DIAGNOSIS — F411 Generalized anxiety disorder: Secondary | ICD-10-CM | POA: Diagnosis not present

## 2022-09-04 DIAGNOSIS — G4700 Insomnia, unspecified: Secondary | ICD-10-CM | POA: Diagnosis not present

## 2022-09-15 DIAGNOSIS — F332 Major depressive disorder, recurrent severe without psychotic features: Secondary | ICD-10-CM | POA: Diagnosis not present

## 2022-09-15 DIAGNOSIS — F4310 Post-traumatic stress disorder, unspecified: Secondary | ICD-10-CM | POA: Diagnosis not present

## 2022-09-16 DIAGNOSIS — F4310 Post-traumatic stress disorder, unspecified: Secondary | ICD-10-CM | POA: Diagnosis not present

## 2022-09-16 DIAGNOSIS — F332 Major depressive disorder, recurrent severe without psychotic features: Secondary | ICD-10-CM | POA: Diagnosis not present

## 2022-09-17 DIAGNOSIS — F332 Major depressive disorder, recurrent severe without psychotic features: Secondary | ICD-10-CM | POA: Diagnosis not present

## 2022-09-17 DIAGNOSIS — F4310 Post-traumatic stress disorder, unspecified: Secondary | ICD-10-CM | POA: Diagnosis not present

## 2022-09-18 DIAGNOSIS — F4310 Post-traumatic stress disorder, unspecified: Secondary | ICD-10-CM | POA: Diagnosis not present

## 2022-09-18 DIAGNOSIS — F332 Major depressive disorder, recurrent severe without psychotic features: Secondary | ICD-10-CM | POA: Diagnosis not present

## 2022-09-19 DIAGNOSIS — F4310 Post-traumatic stress disorder, unspecified: Secondary | ICD-10-CM | POA: Diagnosis not present

## 2022-09-19 DIAGNOSIS — F332 Major depressive disorder, recurrent severe without psychotic features: Secondary | ICD-10-CM | POA: Diagnosis not present

## 2022-09-20 DIAGNOSIS — F332 Major depressive disorder, recurrent severe without psychotic features: Secondary | ICD-10-CM | POA: Diagnosis not present

## 2022-09-20 DIAGNOSIS — F4310 Post-traumatic stress disorder, unspecified: Secondary | ICD-10-CM | POA: Diagnosis not present

## 2022-09-22 DIAGNOSIS — F4310 Post-traumatic stress disorder, unspecified: Secondary | ICD-10-CM | POA: Diagnosis not present

## 2022-09-22 DIAGNOSIS — F332 Major depressive disorder, recurrent severe without psychotic features: Secondary | ICD-10-CM | POA: Diagnosis not present

## 2022-09-23 DIAGNOSIS — F332 Major depressive disorder, recurrent severe without psychotic features: Secondary | ICD-10-CM | POA: Diagnosis not present

## 2022-09-23 DIAGNOSIS — F4310 Post-traumatic stress disorder, unspecified: Secondary | ICD-10-CM | POA: Diagnosis not present

## 2022-09-24 DIAGNOSIS — F4310 Post-traumatic stress disorder, unspecified: Secondary | ICD-10-CM | POA: Diagnosis not present

## 2022-09-24 DIAGNOSIS — F332 Major depressive disorder, recurrent severe without psychotic features: Secondary | ICD-10-CM | POA: Diagnosis not present

## 2022-09-25 DIAGNOSIS — F4310 Post-traumatic stress disorder, unspecified: Secondary | ICD-10-CM | POA: Diagnosis not present

## 2022-09-25 DIAGNOSIS — F332 Major depressive disorder, recurrent severe without psychotic features: Secondary | ICD-10-CM | POA: Diagnosis not present

## 2022-09-26 DIAGNOSIS — F4310 Post-traumatic stress disorder, unspecified: Secondary | ICD-10-CM | POA: Diagnosis not present

## 2022-09-26 DIAGNOSIS — F332 Major depressive disorder, recurrent severe without psychotic features: Secondary | ICD-10-CM | POA: Diagnosis not present

## 2022-09-27 DIAGNOSIS — F332 Major depressive disorder, recurrent severe without psychotic features: Secondary | ICD-10-CM | POA: Diagnosis not present

## 2022-09-27 DIAGNOSIS — F4310 Post-traumatic stress disorder, unspecified: Secondary | ICD-10-CM | POA: Diagnosis not present

## 2022-09-29 DIAGNOSIS — F4310 Post-traumatic stress disorder, unspecified: Secondary | ICD-10-CM | POA: Diagnosis not present

## 2022-09-29 DIAGNOSIS — F332 Major depressive disorder, recurrent severe without psychotic features: Secondary | ICD-10-CM | POA: Diagnosis not present

## 2022-09-30 DIAGNOSIS — F332 Major depressive disorder, recurrent severe without psychotic features: Secondary | ICD-10-CM | POA: Diagnosis not present

## 2022-09-30 DIAGNOSIS — F4310 Post-traumatic stress disorder, unspecified: Secondary | ICD-10-CM | POA: Diagnosis not present

## 2022-10-01 DIAGNOSIS — F4310 Post-traumatic stress disorder, unspecified: Secondary | ICD-10-CM | POA: Diagnosis not present

## 2022-10-01 DIAGNOSIS — F332 Major depressive disorder, recurrent severe without psychotic features: Secondary | ICD-10-CM | POA: Diagnosis not present

## 2022-10-02 DIAGNOSIS — F332 Major depressive disorder, recurrent severe without psychotic features: Secondary | ICD-10-CM | POA: Diagnosis not present

## 2022-10-02 DIAGNOSIS — F4310 Post-traumatic stress disorder, unspecified: Secondary | ICD-10-CM | POA: Diagnosis not present

## 2022-10-03 DIAGNOSIS — F4310 Post-traumatic stress disorder, unspecified: Secondary | ICD-10-CM | POA: Diagnosis not present

## 2022-10-03 DIAGNOSIS — F332 Major depressive disorder, recurrent severe without psychotic features: Secondary | ICD-10-CM | POA: Diagnosis not present

## 2022-10-04 DIAGNOSIS — F332 Major depressive disorder, recurrent severe without psychotic features: Secondary | ICD-10-CM | POA: Diagnosis not present

## 2022-10-04 DIAGNOSIS — F4310 Post-traumatic stress disorder, unspecified: Secondary | ICD-10-CM | POA: Diagnosis not present

## 2022-10-06 DIAGNOSIS — F4310 Post-traumatic stress disorder, unspecified: Secondary | ICD-10-CM | POA: Diagnosis not present

## 2022-10-06 DIAGNOSIS — F332 Major depressive disorder, recurrent severe without psychotic features: Secondary | ICD-10-CM | POA: Diagnosis not present

## 2022-10-07 DIAGNOSIS — F332 Major depressive disorder, recurrent severe without psychotic features: Secondary | ICD-10-CM | POA: Diagnosis not present

## 2022-10-07 DIAGNOSIS — F4310 Post-traumatic stress disorder, unspecified: Secondary | ICD-10-CM | POA: Diagnosis not present

## 2022-10-08 DIAGNOSIS — F4310 Post-traumatic stress disorder, unspecified: Secondary | ICD-10-CM | POA: Diagnosis not present

## 2022-10-08 DIAGNOSIS — F332 Major depressive disorder, recurrent severe without psychotic features: Secondary | ICD-10-CM | POA: Diagnosis not present

## 2022-10-09 DIAGNOSIS — F4310 Post-traumatic stress disorder, unspecified: Secondary | ICD-10-CM | POA: Diagnosis not present

## 2022-10-09 DIAGNOSIS — F332 Major depressive disorder, recurrent severe without psychotic features: Secondary | ICD-10-CM | POA: Diagnosis not present

## 2022-10-10 DIAGNOSIS — F4310 Post-traumatic stress disorder, unspecified: Secondary | ICD-10-CM | POA: Diagnosis not present

## 2022-10-10 DIAGNOSIS — F332 Major depressive disorder, recurrent severe without psychotic features: Secondary | ICD-10-CM | POA: Diagnosis not present

## 2022-10-11 DIAGNOSIS — F4310 Post-traumatic stress disorder, unspecified: Secondary | ICD-10-CM | POA: Diagnosis not present

## 2022-10-11 DIAGNOSIS — F332 Major depressive disorder, recurrent severe without psychotic features: Secondary | ICD-10-CM | POA: Diagnosis not present

## 2022-10-13 DIAGNOSIS — F4310 Post-traumatic stress disorder, unspecified: Secondary | ICD-10-CM | POA: Diagnosis not present

## 2022-10-13 DIAGNOSIS — F332 Major depressive disorder, recurrent severe without psychotic features: Secondary | ICD-10-CM | POA: Diagnosis not present

## 2022-10-14 DIAGNOSIS — F4310 Post-traumatic stress disorder, unspecified: Secondary | ICD-10-CM | POA: Diagnosis not present

## 2022-10-14 DIAGNOSIS — F332 Major depressive disorder, recurrent severe without psychotic features: Secondary | ICD-10-CM | POA: Diagnosis not present

## 2022-10-15 DIAGNOSIS — F4310 Post-traumatic stress disorder, unspecified: Secondary | ICD-10-CM | POA: Diagnosis not present

## 2022-10-15 DIAGNOSIS — F332 Major depressive disorder, recurrent severe without psychotic features: Secondary | ICD-10-CM | POA: Diagnosis not present

## 2022-10-16 DIAGNOSIS — F4310 Post-traumatic stress disorder, unspecified: Secondary | ICD-10-CM | POA: Diagnosis not present

## 2022-10-16 DIAGNOSIS — F332 Major depressive disorder, recurrent severe without psychotic features: Secondary | ICD-10-CM | POA: Diagnosis not present

## 2022-10-17 DIAGNOSIS — F332 Major depressive disorder, recurrent severe without psychotic features: Secondary | ICD-10-CM | POA: Diagnosis not present

## 2022-10-17 DIAGNOSIS — F4310 Post-traumatic stress disorder, unspecified: Secondary | ICD-10-CM | POA: Diagnosis not present

## 2022-10-18 DIAGNOSIS — F332 Major depressive disorder, recurrent severe without psychotic features: Secondary | ICD-10-CM | POA: Diagnosis not present

## 2022-10-18 DIAGNOSIS — F4310 Post-traumatic stress disorder, unspecified: Secondary | ICD-10-CM | POA: Diagnosis not present

## 2022-10-20 DIAGNOSIS — F332 Major depressive disorder, recurrent severe without psychotic features: Secondary | ICD-10-CM | POA: Diagnosis not present

## 2022-10-20 DIAGNOSIS — F4310 Post-traumatic stress disorder, unspecified: Secondary | ICD-10-CM | POA: Diagnosis not present

## 2022-10-21 DIAGNOSIS — F4310 Post-traumatic stress disorder, unspecified: Secondary | ICD-10-CM | POA: Diagnosis not present

## 2022-10-21 DIAGNOSIS — F332 Major depressive disorder, recurrent severe without psychotic features: Secondary | ICD-10-CM | POA: Diagnosis not present

## 2022-10-22 DIAGNOSIS — F332 Major depressive disorder, recurrent severe without psychotic features: Secondary | ICD-10-CM | POA: Diagnosis not present

## 2022-10-22 DIAGNOSIS — F4310 Post-traumatic stress disorder, unspecified: Secondary | ICD-10-CM | POA: Diagnosis not present

## 2022-10-23 DIAGNOSIS — F332 Major depressive disorder, recurrent severe without psychotic features: Secondary | ICD-10-CM | POA: Diagnosis not present

## 2022-10-23 DIAGNOSIS — F4310 Post-traumatic stress disorder, unspecified: Secondary | ICD-10-CM | POA: Diagnosis not present

## 2022-10-24 DIAGNOSIS — F4310 Post-traumatic stress disorder, unspecified: Secondary | ICD-10-CM | POA: Diagnosis not present

## 2022-10-24 DIAGNOSIS — F332 Major depressive disorder, recurrent severe without psychotic features: Secondary | ICD-10-CM | POA: Diagnosis not present

## 2022-10-25 DIAGNOSIS — F4310 Post-traumatic stress disorder, unspecified: Secondary | ICD-10-CM | POA: Diagnosis not present

## 2022-10-25 DIAGNOSIS — F332 Major depressive disorder, recurrent severe without psychotic features: Secondary | ICD-10-CM | POA: Diagnosis not present

## 2022-10-27 DIAGNOSIS — F332 Major depressive disorder, recurrent severe without psychotic features: Secondary | ICD-10-CM | POA: Diagnosis not present

## 2022-10-27 DIAGNOSIS — F4310 Post-traumatic stress disorder, unspecified: Secondary | ICD-10-CM | POA: Diagnosis not present

## 2023-10-10 DIAGNOSIS — Z3689 Encounter for other specified antenatal screening: Secondary | ICD-10-CM | POA: Diagnosis not present

## 2023-10-10 DIAGNOSIS — O26891 Other specified pregnancy related conditions, first trimester: Secondary | ICD-10-CM | POA: Diagnosis not present

## 2023-10-10 DIAGNOSIS — N939 Abnormal uterine and vaginal bleeding, unspecified: Secondary | ICD-10-CM | POA: Diagnosis not present

## 2023-10-10 DIAGNOSIS — Z3A13 13 weeks gestation of pregnancy: Secondary | ICD-10-CM | POA: Diagnosis not present

## 2023-11-10 DIAGNOSIS — Z3402 Encounter for supervision of normal first pregnancy, second trimester: Secondary | ICD-10-CM | POA: Diagnosis not present

## 2023-11-10 DIAGNOSIS — O99210 Obesity complicating pregnancy, unspecified trimester: Secondary | ICD-10-CM | POA: Diagnosis not present

## 2023-11-26 DIAGNOSIS — Z3482 Encounter for supervision of other normal pregnancy, second trimester: Secondary | ICD-10-CM | POA: Diagnosis not present

## 2023-11-26 DIAGNOSIS — Z3A19 19 weeks gestation of pregnancy: Secondary | ICD-10-CM | POA: Diagnosis not present

## 2023-11-27 DIAGNOSIS — Z3402 Encounter for supervision of normal first pregnancy, second trimester: Secondary | ICD-10-CM | POA: Diagnosis not present

## 2023-11-30 DIAGNOSIS — O3412 Maternal care for benign tumor of corpus uteri, second trimester: Secondary | ICD-10-CM | POA: Diagnosis not present

## 2023-11-30 DIAGNOSIS — O99212 Obesity complicating pregnancy, second trimester: Secondary | ICD-10-CM | POA: Diagnosis not present

## 2023-11-30 DIAGNOSIS — Z363 Encounter for antenatal screening for malformations: Secondary | ICD-10-CM | POA: Diagnosis not present

## 2023-11-30 DIAGNOSIS — E6689 Other obesity not elsewhere classified: Secondary | ICD-10-CM | POA: Diagnosis not present

## 2023-11-30 DIAGNOSIS — Z3A20 20 weeks gestation of pregnancy: Secondary | ICD-10-CM | POA: Diagnosis not present

## 2023-12-23 DIAGNOSIS — Z3402 Encounter for supervision of normal first pregnancy, second trimester: Secondary | ICD-10-CM | POA: Diagnosis not present

## 2023-12-23 DIAGNOSIS — R7302 Impaired glucose tolerance (oral): Secondary | ICD-10-CM | POA: Diagnosis not present

## 2023-12-23 DIAGNOSIS — O99210 Obesity complicating pregnancy, unspecified trimester: Secondary | ICD-10-CM | POA: Diagnosis not present

## 2023-12-23 DIAGNOSIS — Z789 Other specified health status: Secondary | ICD-10-CM | POA: Diagnosis not present

## 2023-12-23 DIAGNOSIS — Z148 Genetic carrier of other disease: Secondary | ICD-10-CM | POA: Diagnosis not present

## 2023-12-23 DIAGNOSIS — J4520 Mild intermittent asthma, uncomplicated: Secondary | ICD-10-CM | POA: Diagnosis not present

## 2024-01-26 DIAGNOSIS — Z87898 Personal history of other specified conditions: Secondary | ICD-10-CM | POA: Diagnosis not present

## 2024-01-26 DIAGNOSIS — O365920 Maternal care for other known or suspected poor fetal growth, second trimester, not applicable or unspecified: Secondary | ICD-10-CM | POA: Diagnosis not present

## 2024-01-26 DIAGNOSIS — O365930 Maternal care for other known or suspected poor fetal growth, third trimester, not applicable or unspecified: Secondary | ICD-10-CM | POA: Diagnosis not present

## 2024-01-26 DIAGNOSIS — Z148 Genetic carrier of other disease: Secondary | ICD-10-CM | POA: Diagnosis not present

## 2024-01-26 DIAGNOSIS — E669 Obesity, unspecified: Secondary | ICD-10-CM | POA: Diagnosis not present

## 2024-01-26 DIAGNOSIS — O359XX1 Maternal care for (suspected) fetal abnormality and damage, unspecified, fetus 1: Secondary | ICD-10-CM | POA: Diagnosis not present

## 2024-01-26 DIAGNOSIS — J4520 Mild intermittent asthma, uncomplicated: Secondary | ICD-10-CM | POA: Diagnosis not present

## 2024-01-26 DIAGNOSIS — Z3A28 28 weeks gestation of pregnancy: Secondary | ICD-10-CM | POA: Diagnosis not present

## 2024-01-26 DIAGNOSIS — O99213 Obesity complicating pregnancy, third trimester: Secondary | ICD-10-CM | POA: Diagnosis not present

## 2024-01-26 DIAGNOSIS — Z3402 Encounter for supervision of normal first pregnancy, second trimester: Secondary | ICD-10-CM | POA: Diagnosis not present

## 2024-01-26 DIAGNOSIS — F17290 Nicotine dependence, other tobacco product, uncomplicated: Secondary | ICD-10-CM | POA: Diagnosis not present

## 2024-01-26 DIAGNOSIS — O99210 Obesity complicating pregnancy, unspecified trimester: Secondary | ICD-10-CM | POA: Diagnosis not present

## 2024-01-26 DIAGNOSIS — O99340 Other mental disorders complicating pregnancy, unspecified trimester: Secondary | ICD-10-CM | POA: Diagnosis not present

## 2024-01-26 DIAGNOSIS — O365990 Maternal care for other known or suspected poor fetal growth, unspecified trimester, not applicable or unspecified: Secondary | ICD-10-CM | POA: Diagnosis not present

## 2024-02-09 DIAGNOSIS — O365990 Maternal care for other known or suspected poor fetal growth, unspecified trimester, not applicable or unspecified: Secondary | ICD-10-CM | POA: Diagnosis not present

## 2024-02-23 DIAGNOSIS — O365990 Maternal care for other known or suspected poor fetal growth, unspecified trimester, not applicable or unspecified: Secondary | ICD-10-CM | POA: Diagnosis not present

## 2024-02-23 DIAGNOSIS — Z364 Encounter for antenatal screening for fetal growth retardation: Secondary | ICD-10-CM | POA: Diagnosis not present

## 2024-02-25 DIAGNOSIS — E669 Obesity, unspecified: Secondary | ICD-10-CM | POA: Diagnosis not present

## 2024-02-25 DIAGNOSIS — O365930 Maternal care for other known or suspected poor fetal growth, third trimester, not applicable or unspecified: Secondary | ICD-10-CM | POA: Diagnosis not present

## 2024-02-25 DIAGNOSIS — Z3A32 32 weeks gestation of pregnancy: Secondary | ICD-10-CM | POA: Diagnosis not present

## 2024-02-25 DIAGNOSIS — O99213 Obesity complicating pregnancy, third trimester: Secondary | ICD-10-CM | POA: Diagnosis not present

## 2024-03-01 DIAGNOSIS — Z364 Encounter for antenatal screening for fetal growth retardation: Secondary | ICD-10-CM | POA: Diagnosis not present

## 2024-03-04 DIAGNOSIS — R7302 Impaired glucose tolerance (oral): Secondary | ICD-10-CM | POA: Diagnosis not present

## 2024-03-04 DIAGNOSIS — Z3483 Encounter for supervision of other normal pregnancy, third trimester: Secondary | ICD-10-CM | POA: Diagnosis not present

## 2024-03-04 DIAGNOSIS — O365930 Maternal care for other known or suspected poor fetal growth, third trimester, not applicable or unspecified: Secondary | ICD-10-CM | POA: Diagnosis not present

## 2024-03-04 DIAGNOSIS — Z87898 Personal history of other specified conditions: Secondary | ICD-10-CM | POA: Diagnosis not present

## 2024-03-04 DIAGNOSIS — Z3A33 33 weeks gestation of pregnancy: Secondary | ICD-10-CM | POA: Diagnosis not present

## 2024-03-07 DIAGNOSIS — Z364 Encounter for antenatal screening for fetal growth retardation: Secondary | ICD-10-CM | POA: Diagnosis not present

## 2024-03-09 DIAGNOSIS — Z3A34 34 weeks gestation of pregnancy: Secondary | ICD-10-CM | POA: Diagnosis not present

## 2024-03-09 DIAGNOSIS — O365930 Maternal care for other known or suspected poor fetal growth, third trimester, not applicable or unspecified: Secondary | ICD-10-CM | POA: Diagnosis not present

## 2024-03-09 DIAGNOSIS — O24410 Gestational diabetes mellitus in pregnancy, diet controlled: Secondary | ICD-10-CM | POA: Diagnosis not present
